# Patient Record
Sex: Male | Born: 1960 | Race: White | Hispanic: No | Marital: Married | Smoking: Never smoker
Health system: Southern US, Community
[De-identification: ages and names within clinical notes are randomized; demographics above are authoritative.]

## PROBLEM LIST (undated history)

## (undated) DIAGNOSIS — E119 Type 2 diabetes mellitus without complications: Secondary | ICD-10-CM

## (undated) DIAGNOSIS — E1140 Type 2 diabetes mellitus with diabetic neuropathy, unspecified: Secondary | ICD-10-CM

## (undated) DIAGNOSIS — E039 Hypothyroidism, unspecified: Secondary | ICD-10-CM

## (undated) DIAGNOSIS — E0590 Thyrotoxicosis, unspecified without thyrotoxic crisis or storm: Secondary | ICD-10-CM

## (undated) DIAGNOSIS — I10 Essential (primary) hypertension: Secondary | ICD-10-CM

## (undated) DIAGNOSIS — Z9641 Presence of insulin pump (external) (internal): Secondary | ICD-10-CM

## (undated) DIAGNOSIS — R011 Cardiac murmur, unspecified: Secondary | ICD-10-CM

## (undated) DIAGNOSIS — L0390 Cellulitis, unspecified: Secondary | ICD-10-CM

## (undated) DIAGNOSIS — J302 Other seasonal allergic rhinitis: Secondary | ICD-10-CM

## (undated) DIAGNOSIS — N189 Chronic kidney disease, unspecified: Secondary | ICD-10-CM

## (undated) HISTORY — PX: BACK SURGERY: SHX140

## (undated) HISTORY — PX: NECK SURGERY: SHX720

## (undated) HISTORY — PX: SHOULDER SURGERY: SHX246

## (undated) HISTORY — PX: CARDIAC CATHETERIZATION: SHX172

---

## 1985-09-25 HISTORY — PX: ANKLE RECONSTRUCTION: SHX1151

## 2005-06-15 ENCOUNTER — Emergency Department: Payer: Self-pay | Admitting: Unknown Physician Specialty

## 2005-06-15 ENCOUNTER — Other Ambulatory Visit: Payer: Self-pay

## 2006-07-21 ENCOUNTER — Inpatient Hospital Stay: Payer: Self-pay | Admitting: Rheumatology

## 2006-07-21 ENCOUNTER — Other Ambulatory Visit: Payer: Self-pay

## 2007-09-23 ENCOUNTER — Emergency Department: Payer: Self-pay | Admitting: Emergency Medicine

## 2008-04-26 ENCOUNTER — Observation Stay: Payer: Self-pay | Admitting: Internal Medicine

## 2008-04-26 ENCOUNTER — Other Ambulatory Visit: Payer: Self-pay

## 2008-05-09 ENCOUNTER — Emergency Department: Payer: Self-pay | Admitting: Emergency Medicine

## 2008-05-09 ENCOUNTER — Other Ambulatory Visit: Payer: Self-pay

## 2009-07-04 ENCOUNTER — Inpatient Hospital Stay: Payer: Self-pay | Admitting: Internal Medicine

## 2009-08-09 ENCOUNTER — Ambulatory Visit: Payer: Self-pay | Admitting: General Practice

## 2009-08-10 ENCOUNTER — Ambulatory Visit: Payer: Self-pay | Admitting: Orthopedic Surgery

## 2009-08-25 ENCOUNTER — Ambulatory Visit: Payer: Self-pay | Admitting: Orthopedic Surgery

## 2010-02-16 ENCOUNTER — Emergency Department: Payer: Self-pay | Admitting: Emergency Medicine

## 2010-08-30 ENCOUNTER — Emergency Department: Payer: Self-pay | Admitting: Emergency Medicine

## 2010-12-31 ENCOUNTER — Emergency Department: Payer: Self-pay | Admitting: Emergency Medicine

## 2011-04-04 ENCOUNTER — Observation Stay: Payer: Self-pay | Admitting: *Deleted

## 2013-02-24 ENCOUNTER — Ambulatory Visit: Payer: Self-pay | Admitting: General Practice

## 2013-11-18 ENCOUNTER — Ambulatory Visit: Payer: Self-pay | Admitting: Otolaryngology

## 2014-01-25 ENCOUNTER — Emergency Department: Payer: Self-pay | Admitting: Emergency Medicine

## 2014-01-25 DIAGNOSIS — R011 Cardiac murmur, unspecified: Secondary | ICD-10-CM | POA: Insufficient documentation

## 2014-01-25 LAB — CBC
HCT: 39.9 % — AB (ref 40.0–52.0)
HGB: 13.6 g/dL (ref 13.0–18.0)
MCH: 30.6 pg (ref 26.0–34.0)
MCHC: 34.1 g/dL (ref 32.0–36.0)
MCV: 90 fL (ref 80–100)
Platelet: 183 10*3/uL (ref 150–440)
RBC: 4.45 10*6/uL (ref 4.40–5.90)
RDW: 13 % (ref 11.5–14.5)
WBC: 6.7 10*3/uL (ref 3.8–10.6)

## 2014-01-25 LAB — BASIC METABOLIC PANEL
ANION GAP: 7 (ref 7–16)
BUN: 15 mg/dL (ref 7–18)
CHLORIDE: 99 mmol/L (ref 98–107)
CREATININE: 1.41 mg/dL — AB (ref 0.60–1.30)
Calcium, Total: 9 mg/dL (ref 8.5–10.1)
Co2: 28 mmol/L (ref 21–32)
EGFR (Non-African Amer.): 58 — ABNORMAL LOW
Glucose: 90 mg/dL (ref 65–99)
OSMOLALITY: 269 (ref 275–301)
POTASSIUM: 3.7 mmol/L (ref 3.5–5.1)
Sodium: 134 mmol/L — ABNORMAL LOW (ref 136–145)

## 2014-01-25 LAB — TROPONIN I: Troponin-I: <0.02 ng/mL

## 2014-01-28 ENCOUNTER — Ambulatory Visit: Payer: Self-pay | Admitting: General Practice

## 2014-01-29 DIAGNOSIS — E119 Type 2 diabetes mellitus without complications: Secondary | ICD-10-CM | POA: Insufficient documentation

## 2014-01-29 DIAGNOSIS — I10 Essential (primary) hypertension: Secondary | ICD-10-CM | POA: Insufficient documentation

## 2014-01-29 DIAGNOSIS — R079 Chest pain, unspecified: Secondary | ICD-10-CM | POA: Insufficient documentation

## 2014-01-29 DIAGNOSIS — E039 Hypothyroidism, unspecified: Secondary | ICD-10-CM | POA: Insufficient documentation

## 2014-05-02 ENCOUNTER — Inpatient Hospital Stay: Payer: Self-pay | Admitting: Internal Medicine

## 2014-05-02 LAB — BASIC METABOLIC PANEL
ANION GAP: 21 — AB (ref 7–16)
Anion Gap: 21 — ABNORMAL HIGH (ref 7–16)
Anion Gap: 15 (ref 7–16)
BUN: 31 mg/dL — ABNORMAL HIGH (ref 7–18)
BUN: 34 mg/dL — AB (ref 7–18)
BUN: 39 mg/dL — ABNORMAL HIGH (ref 7–18)
CALCIUM: 8.5 mg/dL (ref 8.5–10.1)
CHLORIDE: 96 mmol/L — AB (ref 98–107)
CO2: 18 mmol/L — AB (ref 21–32)
CREATININE: 2.49 mg/dL — AB (ref 0.60–1.30)
Calcium, Total: 9.1 mg/dL (ref 8.5–10.1)
Calcium, Total: 9.1 mg/dL (ref 8.5–10.1)
Chloride: 88 mmol/L — ABNORMAL LOW (ref 98–107)
Chloride: 91 mmol/L — ABNORMAL LOW (ref 98–107)
Co2: 17 mmol/L — ABNORMAL LOW (ref 21–32)
Co2: 22 mmol/L (ref 21–32)
Creatinine: 1.82 mg/dL — ABNORMAL HIGH (ref 0.60–1.30)
Creatinine: 1.8 mg/dL — ABNORMAL HIGH (ref 0.60–1.30)
EGFR (African American): 49 — ABNORMAL LOW
EGFR (African American): 34 — ABNORMAL LOW
EGFR (Non-African Amer.): 42 — ABNORMAL LOW
GFR CALC AF AMER: 50 — AB
GFR CALC NON AF AMER: 43 — AB
GFR CALC NON AF AMER: 29 — AB
GLUCOSE: 597 mg/dL — AB (ref 65–99)
GLUCOSE: 490 mg/dL — AB (ref 65–99)
Glucose: 566 mg/dL (ref 65–99)
OSMOLALITY: 298 (ref 275–301)
Osmolality: 288 (ref 275–301)
Osmolality: 294 (ref 275–301)
POTASSIUM: 6.7 mmol/L — AB (ref 3.5–5.1)
Potassium: 6.3 mmol/L — ABNORMAL HIGH (ref 3.5–5.1)
Potassium: 4.6 mmol/L (ref 3.5–5.1)
SODIUM: 129 mmol/L — AB (ref 136–145)
Sodium: 127 mmol/L — ABNORMAL LOW (ref 136–145)
Sodium: 133 mmol/L — ABNORMAL LOW (ref 136–145)

## 2014-05-02 LAB — CBC
HCT: 38.3 % — AB (ref 40.0–52.0)
HGB: 12.4 g/dL — ABNORMAL LOW (ref 13.0–18.0)
MCH: 31.1 pg (ref 26.0–34.0)
MCHC: 32.4 g/dL (ref 32.0–36.0)
MCV: 96 fL (ref 80–100)
Platelet: 225 10*3/uL (ref 150–440)
RBC: 3.99 10*6/uL — ABNORMAL LOW (ref 4.40–5.90)
RDW: 13.1 % (ref 11.5–14.5)
WBC: 11.3 10*3/uL — ABNORMAL HIGH (ref 3.8–10.6)

## 2014-05-03 LAB — URINALYSIS, COMPLETE
BILIRUBIN, UR: NEGATIVE
BLOOD: NEGATIVE
Leukocyte Esterase: NEGATIVE
Nitrite: NEGATIVE
PROTEIN: NEGATIVE
Ph: 5 (ref 4.5–8.0)
SPECIFIC GRAVITY: 1.01 (ref 1.003–1.030)

## 2014-05-03 LAB — CBC WITH DIFFERENTIAL/PLATELET
BASOS PCT: 0.8 %
Basophil #: 0.1 10*3/uL (ref 0.0–0.1)
EOS ABS: 0 10*3/uL (ref 0.0–0.7)
Eosinophil %: 0.2 %
HCT: 33.7 % — ABNORMAL LOW (ref 40.0–52.0)
HGB: 11.1 g/dL — ABNORMAL LOW (ref 13.0–18.0)
Lymphocyte #: 1.6 10*3/uL (ref 1.0–3.6)
Lymphocyte %: 14.6 %
MCH: 31.3 pg (ref 26.0–34.0)
MCHC: 32.9 g/dL (ref 32.0–36.0)
MCV: 95 fL (ref 80–100)
MONO ABS: 1 x10 3/mm (ref 0.2–1.0)
Monocyte %: 9.2 %
Neutrophil #: 8.4 10*3/uL — ABNORMAL HIGH (ref 1.4–6.5)
Neutrophil %: 75.2 %
Platelet: 220 10*3/uL (ref 150–440)
RBC: 3.55 10*6/uL — ABNORMAL LOW (ref 4.40–5.90)
RDW: 13.4 % (ref 11.5–14.5)
WBC: 11.2 10*3/uL — ABNORMAL HIGH (ref 3.8–10.6)

## 2014-05-03 LAB — BASIC METABOLIC PANEL
ANION GAP: 7 (ref 7–16)
Anion Gap: 7 (ref 7–16)
BUN: 38 mg/dL — ABNORMAL HIGH (ref 7–18)
BUN: 40 mg/dL — ABNORMAL HIGH (ref 7–18)
Calcium, Total: 8.3 mg/dL — ABNORMAL LOW (ref 8.5–10.1)
Calcium, Total: 8.4 mg/dL — ABNORMAL LOW (ref 8.5–10.1)
Chloride: 102 mmol/L (ref 98–107)
Chloride: 103 mmol/L (ref 98–107)
Co2: 27 mmol/L (ref 21–32)
Co2: 29 mmol/L (ref 21–32)
Creatinine: 1.98 mg/dL — ABNORMAL HIGH (ref 0.60–1.30)
Creatinine: 2.37 mg/dL — ABNORMAL HIGH (ref 0.60–1.30)
EGFR (African American): 36 — ABNORMAL LOW
EGFR (African American): 44 — ABNORMAL LOW
EGFR (Non-African Amer.): 31 — ABNORMAL LOW
EGFR (Non-African Amer.): 38 — ABNORMAL LOW
GLUCOSE: 137 mg/dL — AB (ref 65–99)
Glucose: 233 mg/dL — ABNORMAL HIGH (ref 65–99)
OSMOLALITY: 293 (ref 275–301)
Osmolality: 285 (ref 275–301)
Potassium: 3.9 mmol/L (ref 3.5–5.1)
Potassium: 4.8 mmol/L (ref 3.5–5.1)
SODIUM: 137 mmol/L (ref 136–145)
Sodium: 138 mmol/L (ref 136–145)

## 2014-05-03 LAB — TSH: THYROID STIMULATING HORM: 0.824 u[IU]/mL

## 2014-05-03 LAB — LIPID PANEL
Cholesterol: 123 mg/dL (ref 0–200)
HDL Cholesterol: 64 mg/dL — ABNORMAL HIGH (ref 40–60)
Ldl Cholesterol, Calc: 47 mg/dL (ref 0–100)
Triglycerides: 62 mg/dL (ref 0–200)
VLDL CHOLESTEROL, CALC: 12 mg/dL (ref 5–40)

## 2014-05-03 LAB — HEMOGLOBIN A1C: Hemoglobin A1C: 10.4 % — ABNORMAL HIGH (ref 4.2–6.3)

## 2014-05-03 LAB — MAGNESIUM: Magnesium: 2.3 mg/dL

## 2014-05-04 LAB — BASIC METABOLIC PANEL
Anion Gap: 12 (ref 7–16)
BUN: 36 mg/dL — ABNORMAL HIGH (ref 7–18)
CALCIUM: 7.9 mg/dL — AB (ref 8.5–10.1)
CHLORIDE: 102 mmol/L (ref 98–107)
CO2: 23 mmol/L (ref 21–32)
CREATININE: 1.65 mg/dL — AB (ref 0.60–1.30)
EGFR (African American): 55 — ABNORMAL LOW
GFR CALC NON AF AMER: 48 — AB
Glucose: 385 mg/dL — ABNORMAL HIGH (ref 65–99)
Osmolality: 298 (ref 275–301)
POTASSIUM: 5.4 mmol/L — AB (ref 3.5–5.1)
SODIUM: 137 mmol/L (ref 136–145)

## 2014-05-05 LAB — BASIC METABOLIC PANEL
Anion Gap: 5 — ABNORMAL LOW (ref 7–16)
BUN: 39 mg/dL — AB (ref 7–18)
CO2: 31 mmol/L (ref 21–32)
CREATININE: 1.58 mg/dL — AB (ref 0.60–1.30)
Calcium, Total: 8.6 mg/dL (ref 8.5–10.1)
Chloride: 101 mmol/L (ref 98–107)
EGFR (Non-African Amer.): 50 — ABNORMAL LOW
GFR CALC AF AMER: 58 — AB
GLUCOSE: 237 mg/dL — AB (ref 65–99)
Osmolality: 291 (ref 275–301)
Potassium: 5.4 mmol/L — ABNORMAL HIGH (ref 3.5–5.1)
Sodium: 137 mmol/L (ref 136–145)

## 2014-11-23 DIAGNOSIS — R053 Chronic cough: Secondary | ICD-10-CM | POA: Insufficient documentation

## 2014-11-23 DIAGNOSIS — R809 Proteinuria, unspecified: Secondary | ICD-10-CM | POA: Insufficient documentation

## 2014-11-23 DIAGNOSIS — R05 Cough: Secondary | ICD-10-CM | POA: Insufficient documentation

## 2014-11-23 DIAGNOSIS — E109 Type 1 diabetes mellitus without complications: Secondary | ICD-10-CM | POA: Insufficient documentation

## 2014-11-23 DIAGNOSIS — Z9641 Presence of insulin pump (external) (internal): Secondary | ICD-10-CM | POA: Insufficient documentation

## 2015-01-16 NOTE — Discharge Summary (Signed)
Dates of Admission and Diagnosis:  Date of Admission 02-May-2014   Date of Discharge 05-May-2014   Admitting Diagnosis DKA   Final Diagnosis Insulin dependent DM- presents with DKA Dehydration Neck swelling and swellowing difficulty - post neck surgery 6 days ago- improved with steroids. Htn Hyperkalemia Ac renal failure due to dehydration    Chief Complaint/History of Present Illness A 54 year old Caucasian male with a history of diabetes and hypothyroidism, presented to the ED with the above chief complaint. The patient is awake, alert, oriented, in no acute distress. The patient has neck pain, unable to talk much, history is obtained from his wife. The patient had neck cervical surgery 1 week ago. After the surgery, the patient has had slight neck swelling and pain, unable to eat and drink. The patient has less urine. According to the patient's wife, the patient's blood sugar has been high, even the patient is using an insulin pump. The patient denies any fever or chills. No headache or dizziness. No chest pain, palpitation. No abdominal pain, nausea, vomiting or diarrhea. The patient went to [facility] the [facility] the [facility], got a neck x-ray. According to the patient's wife, the surgeon said no problems with the neck surgery. The patient's blood sugar is high at 597, anion gap 21, potassium 6.7. He was treated with insulin 6 units and then started insulin drip.   Allergies:  Zolpidem: Hallucinations  Lab:  08-Aug-15 16:05   pH (ABG)  7.16 (7.35-7.45 NOTE: New Reference Range 04/18/14)  PCO2 37 (32-48 NOTE: New Reference Range 04/18/14)  PO2  77 (83-108 NOTE: New Reference Range 04/18/14)  FiO2 21  Base Excess  -14.7 (-2.0-3.0 NOTE: New Reference Range 04/18/14)  HCO3  13.2 (21-28 NOTE: New Reference Range 04/18/14)  O2 Saturation 94.0  O2 Device ra  Specimen Site (ABG) RT RADIAL  Specimen Type (ABG) ARTERIAL  Patient Temp (ABG) 37.0  Lactic Acid, Cardiopulmonary  2.3  (0.30-0.80 NOTE: New Reference Range 04/18/14)  Routine Chem:  08-Aug-15 13:22   Glucose, Serum  566  BUN  31  Creatinine (comp)  1.82  Sodium, Serum  127  Potassium, Serum  6.7  Chloride, Serum  88  CO2, Serum  18  Calcium (Total), Serum 9.1  Anion Gap  21  Osmolality (calc) 288  eGFR (African American)  49  eGFR (Non-African American)  42 (eGFR values <80mL/min/1.73 m2 may be an indication of chronic kidney disease (CKD). Calculated eGFR is useful in patients with stable renal function. The eGFR calculation will not be reliable in acutely ill patients when serum creatinine is changing rapidly. It is not useful in  patients on dialysis. The eGFR calculation may not be applicable to patients at the low and high extremes of body sizes, pregnant women, and vegetarians.)  Result Comment GLUCOSE/POTASSIUM - RESULTS VERIFIED BY REPEAT TESTING.  - NOTIFIED OF CRITICAL VALUE  - C/SONJA WEAVER AT 16 05/02/14-DAS  - READ-BACK PROCESS PERFORMED. ANION GAP - RESULTS VERIFIED BY REPEAT TESTING.  Result(s) reported on 02 May 2014 at 02:18PM.    16:05   Result Comment rb - READ-BACK PROCESS PERFORMED.  - called to sonjia, rn  Result(s) reported on 02 May 2014 at 04:17PM.  11-Aug-15 04:45   Glucose, Serum  237  BUN  39  Creatinine (comp)  1.58  Sodium, Serum 137  Potassium, Serum  5.4  Chloride, Serum 101  CO2, Serum 31  Calcium (Total), Serum 8.6  Anion Gap  5  Osmolality (calc) 291  eGFR (  African American)  58  eGFR (Non-African American)  50 (eGFR values <63mL/min/1.73 m2 may be an indication of chronic kidney disease (CKD). Calculated eGFR is useful in patients with stable renal function. The eGFR calculation will not be reliable in acutely ill patients when serum creatinine is changing rapidly. It is not useful in  patients on dialysis. The eGFR calculation may not be applicable to patients at the low and high extremes of body sizes, pregnant women, and vegetarians.)   Routine UA:  09-Aug-15 12:57   Color (UA) YELLOW  Clarity (UA) CLEAR  Glucose (UA) 300 mg/dL  Bilirubin (UA) NEGATIVE  Ketones (UA) 2+  Specific Gravity (UA) 1.010  Blood (UA) NEGATIVE  pH (UA) 5.0  Protein (UA) NEGATIVE  Nitrite (UA) NEGATIVE  Leukocyte Esterase (UA) NEGATIVE (Result(s) reported on 03 May 2014 at 02:51PM.)  RBC (UA) 0-5  WBC (UA) 0-5  Bacteria (UA) 1+ (TRACE/FEW)  Epithelial Cells (UA) 0-5 / HPF  Mucous (UA) PRESENT (Result(s) reported on 03 May 2014 at 02:51PM.)  Routine Hem:  08-Aug-15 13:22   WBC (CBC)  11.3  RBC (CBC)  3.99  Hemoglobin (CBC)  12.4  Hematocrit (CBC)  38.3  Platelet Count (CBC) 225 (Result(s) reported on 02 May 2014 at 01:34PM.)  MCV 96  MCH 31.1  MCHC 32.4  RDW 13.1   Pertinent Past History:  Pertinent Past History A 54 year old Caucasian male with a history of diabetes and hypothyroidism, presented to the ED with the above chief complaint. The patient is awake, alert, oriented, in no acute distress. The patient has neck pain, unable to talk much, history is obtained from his wife. The patient had neck cervical surgery 1 week ago. After the surgery, the patient has had slight neck swelling and pain, unable to eat and drink. The patient has less urine. According to the patient's wife, the patient's blood sugar has been high, even the patient is using an insulin pump. The patient denies any fever or chills. No headache or dizziness. No chest pain, palpitation. No abdominal pain, nausea, vomiting or diarrhea. The patient went to see the surgeon in the special hospital, got a neck x-ray. According to the patient's wife, the surgeon said no problems with the neck surgery. The patient's blood sugar is high at 597, anion gap 21, potassium 6.7. He was treated with insulin 6 units and then started insulin drip.   Hospital Course:  Hospital Course 54 m with IDDM, HTN, CKD3 with recent cervical fusion and diskecteomy here with DKA  *  DKA resolved. BS better. Isnulin pump to restarted- with better control of blood sugar now. Unclear why he had DKA. Due to severe dehydration/stress? not eating well? post surgery- neck swelling. Consulted Dr. Gabriel Carina for further recommendations, but she is on vacation with no back up coverage. I spoke to pt and his wife, they are very well educated about carb count and settign insulin pump to take injections.   Told them to restart it now, monitored blood sugar- remained stable.  * ARF with hyperkalemia on CRF - Improving Continued IVF at 150/hr Due to dehydration and DKA.  * HTN meds held due to low normal BP and ARF  * Dyphagia from recent neck surgery Started decadron IV TID given prednoson 1 dose PO, pt already feels much better in his swelling and tolerating soft diet. advised to continue soft diet at home for few days- received only 2 days of steroids- so no need for tapering.  * Pseudohyponatremia- improved with  correction of blood sugar.  * Hyperkalemia- given kayexalate. restarted on insulin pump- and have appointment with Dr. Gabriel Carina in office in next week.   Condition on Discharge Stable   Code Status:  Code Status Full Code   DISCHARGE INSTRUCTIONS HOME MEDS:  Medication Reconciliation: Patient's Home Medications at Discharge:     Medication Instructions  zyrtec 10 mg oral tablet  1 tab(s) orally once a day   nasacort 55 mcg/inh nasal spray  2 spray(s) into each nostril once a day   levothyroxine 175 mcg (0.175 mg) oral tablet  1 tab(s) orally once a day   aspirin 81 mg oral tablet  1 tab(s) orally once a day   oxycodone 5 mg oral tablet  1-2 tab(s) orally every 6 hours, As Needed   enalapril-hydrochlorothiazide 10 mg-25 mg oral tablet  1 tab(s) orally once a day   humalog  Inside insulin pump    STOP TAKING THE FOLLOWING MEDICATION(S):    meloxicam 15 mg oral tablet: 1 tab(s) orally once a day  Physician's Instructions:  Diet Low Sodium  Carbohydrate  Controlled (ADA) Diet   Activity Limitations As tolerated   Return to Work Not Applicable   Time frame for Follow Up Appointment 1-2 weeks  Dr. Gabriel Carina   Other Comments follow with Dr. Gabriel Carina in office in 1 week.   Electronic Signatures: Vaughan Basta (MD)  (Signed 11-Aug-15 16:15)  Authored: ADMISSION DATE AND DIAGNOSIS, CHIEF COMPLAINT/HPI, Allergies, PERTINENT LABS, PERTINENT PAST [place] MEDS, PATIENT INSTRUCTIONS   Last Updated: 11-Aug-15 16:15 by Vaughan Basta (MD)

## 2015-01-16 NOTE — H&P (Signed)
PATIENT NAME:  Jeffrey Medina, [name] MR#:  [id number] DATE OF BIRTH:  02-09-1961  DATE OF ADMISSION:  05/02/2014  PRIMARY CARE PHYSICIAN: Dr. Terance HartBronstein.   REFERRING PHYSICIAN: Dr. Carollee MassedKaminski.   CHIEF COMPLAINT: Weakness and high blood sugar for several days.   HISTORY OF PRESENT ILLNESS: A 54 year old Caucasian male with a history of diabetes and hypothyroidism, presented to the ED with the above chief complaint. The patient is awake, alert, oriented, in no acute distress. The patient has neck pain, unable to talk much, history is obtained from his wife. The patient had neck cervical surgery 1 week ago. After the surgery, the patient has had slight neck swelling and pain, unable to eat and drink. The patient has less urine. According to the patient's wife, the patient's blood sugar has been high, even the patient is using an insulin pump. The patient denies any fever or chills. No headache or dizziness. No chest pain, palpitation. No abdominal pain, nausea, vomiting or diarrhea. The patient went to see the surgeon in the special [facility], got a neck x-ray. According to the patient's wife, the surgeon said no problems with the neck surgery. The patient's blood sugar is high at 597, anion gap 21, potassium 6.7. He was treated with insulin 6 units and then started insulin drip.   PAST MEDICAL HISTORY: Diabetes 1,  hypothyroidism,   PAST SURGICAL HISTORY: Anterior cervical diskectomy last week, sinus surgery this March,  right ankle reconstruction, shoulder and back surgery due to bone spur.   ALLERGIES: ZOLPIDEM.   SOCIAL HISTORY: No smoking, no drinking, or illicit drugs.   FAMILY HISTORY: CAD.   HOME MEDICATIONS:  1.  Zyrtec 10 mg p.o. daily.  2.  Oxycodone 5 mg p.o. 1 to 2 tablets every 6 hours p.r.n.  3.  Nasacort 50 mcg inhalation, 2 sprays into each nostril once a day.  4. Meloxicam 50 mg p.o. daily.  5. Levothyroxine 175 mcg p.o. daily.  6.  Humalog insulin pump.  7.  Enalapril, HCTZ 10  mg/25 mg p.o. daily.  8.  Aspirin 81 mg p.o. daily.   REVIEW OF SYSTEMS: CONSTITUTIONAL: The patient denies any fever or chills. No headache or dizziness.  EYES: No double vision, blurred vision.  ENT: No postnasal drip, slurred speech, but has dysphagia due to neck pain.  CARDIOVASCULAR: No chest pain, palpitation, orthopnea, nocturnal dyspnea. No leg edema.  PULMONARY: No cough, sputum, shortness of breath, or hemoptysis.  GASTROINTESTINAL: No abdominal pain, nausea, vomiting, diarrhea. No melena or bloody stool.  GENITOURINARY: No dysuria, hematuria, or incontinence.  SKIN: No rash or jaundice.  NEUROLOGY: No syncope, loss of consciousness, or seizure.  ENDOCRINOLOGY: No polyuria, polydipsia, heat or cold intolerance.  HEMATOLOGY: No easy bleeding or bleeding.   PHYSICAL EXAMINATION: VITAL SIGNS: Temperature 98.5, blood pressure 100/52, pulse 103, oxygen saturation 98% on room air.  GENERAL: The patient is alert, awake, oriented, in no acute distress.  HEENT: Pupils round, equal, and reactive to light and accommodation. Moist oral mucosa. Unable to see oropharynx.  NECK: Supple. No JVD. No carotid bruits, but has neck swollen with a surgical wound on the left side, tenderness. No erythema. Difficult to estimate whether the patient has lymphadenopathy due to neck swelling and tenderness.  CARDIOVASCULAR: S1, S2. Regular rate, rhythm. No murmur, rubs, gallops.  PULMONARY: Bilateral air entry. No wheezing or rales. No use of accessory muscles to breathe.  ABDOMEN: Soft, no distention, or tenderness. No organomegaly. Bowel sounds present.  EXTREMITIES: No edema, clubbing, or  cyanosis. No calf tenderness. Bilateral pedal pulses present.   SKIN: No rash or jaundice.  NEUROLOGIC: Alert and oriented x 3. No focal deficits. Power 5/5. Sensory intact.     LABORATORY DATA: Glucose 597, BUN 34, creatinine 1.8, potassium 6.3, sodium 129, chloride 91, bicarbonate 17, WBC 11.3, hemoglobin 12.4,  platelets 225. Anion gap 21.    IMPRESSIONS: 1.  Diabetic ketoacidosis.  2.  Hyperkalemia.  3.  Acute renal failure.  4.  Hyponatremia.  5.  Diabetes 1.  6.  Hypothyroidism.   PLAN OF TREATMENT: 1.  The patient will be admitted to CCU. Will start DKA protocol. Continue insulin drip.  2.  Will get normal saline IV. Hold enalapril and HCTZ. Follow up BMP.  3.  Pain control.   I discussed the patient's condition and the plan of treatment with the patient and the patient's wife. The patient wants full code.   CRITICAL TIME: 62 minutes    ____________________________ Shaune Pollack, MD qc:at D: 05/02/2014 16:18:50 ET T: 05/02/2014 17:40:59 ET JOB#: [id number]  cc: Shaune Pollack, MD, <Dictator> Shaune Pollack MD ELECTRONICALLY SIGNED 05/03/2014 15:34

## 2015-06-04 ENCOUNTER — Ambulatory Visit
Admission: RE | Admit: 2015-06-04 | Discharge: 2015-06-04 | Disposition: A | Discharge status: Home / Self Care | Payer: PRIVATE HEALTH INSURANCE | Source: Ambulatory Visit | Attending: Family Medicine | Admitting: Family Medicine

## 2015-06-04 DIAGNOSIS — L0390 Cellulitis, unspecified: Secondary | ICD-10-CM | POA: Insufficient documentation

## 2015-06-04 HISTORY — DX: Cellulitis, unspecified: L03.90

## 2015-06-04 HISTORY — DX: Thyrotoxicosis, unspecified without thyrotoxic crisis or storm: E05.90

## 2015-06-04 HISTORY — DX: Type 2 diabetes mellitus without complications: E11.9

## 2015-06-04 MED ORDER — CLINDAMYCIN PHOSPHATE 600 MG/50ML IV SOLN
600.0000 mg | Freq: Once | INTRAVENOUS | Status: AC
  Administered 2015-06-04: 600 mg via INTRAVENOUS

## 2015-06-04 MED ORDER — SODIUM CHLORIDE 0.9 % IV SOLN
INTRAVENOUS | Status: DC
  Administered 2015-06-04: 11:00:00 via INTRAVENOUS

## 2015-06-04 NOTE — Discharge Instructions (Signed)
Follow up with your Primary doctor or Employee health.  Take medications as prescribed by employee health.  Call [phone]. Same Day Surgery if you shouold have any questions.

## 2015-06-04 NOTE — OR Nursing (Signed)
Tolerated Antibiotic infusion well.  Instructed to call Employee Health Services or PCP if any increased symptoms and to keep any follow up appointments.  Take prescriptions as given from employee health services.  Verbalizes understanding.

## 2015-06-09 ENCOUNTER — Ambulatory Visit
Admission: RE | Admit: 2015-06-09 | Discharge: 2015-06-09 | Disposition: A | Discharge status: Home / Self Care | Payer: PRIVATE HEALTH INSURANCE | Source: Ambulatory Visit | Attending: Family Medicine | Admitting: Family Medicine

## 2015-06-09 DIAGNOSIS — L03119 Cellulitis of unspecified part of limb: Secondary | ICD-10-CM | POA: Insufficient documentation

## 2015-06-09 DIAGNOSIS — E119 Type 2 diabetes mellitus without complications: Secondary | ICD-10-CM | POA: Diagnosis present

## 2015-06-09 LAB — GLUCOSE, CAPILLARY: Glucose-Capillary: 82 mg/dL (ref 65–99)

## 2015-06-09 MED ORDER — SODIUM CHLORIDE 0.9 % IV SOLN
INTRAVENOUS | Status: DC
  Administered 2015-06-09: 75 mL/h via INTRAVENOUS

## 2015-06-09 MED ORDER — SODIUM CHLORIDE 0.9 % IV SOLN
1.0000 g | Freq: Once | INTRAVENOUS | Status: AC
  Administered 2015-06-09: 1 g via INTRAVENOUS
  Filled 2015-06-09: qty 1

## 2015-06-09 NOTE — Progress Notes (Signed)
Pt here for antibiotic therapy 1/3 days, discharged ambulatory, without issue. IV D/Ced

## 2015-06-10 ENCOUNTER — Ambulatory Visit
Admission: RE | Admit: 2015-06-10 | Discharge: 2015-06-10 | Disposition: A | Discharge status: Home / Self Care | Payer: PRIVATE HEALTH INSURANCE | Source: Ambulatory Visit | Attending: Family Medicine | Admitting: Family Medicine

## 2015-06-10 DIAGNOSIS — L0390 Cellulitis, unspecified: Secondary | ICD-10-CM | POA: Diagnosis not present

## 2015-06-10 MED ORDER — SODIUM CHLORIDE 0.9 % IV SOLN
1.0000 g | Freq: Once | INTRAVENOUS | Status: AC
  Administered 2015-06-10: 1 g via INTRAVENOUS
  Filled 2015-06-10: qty 1

## 2015-06-10 MED ORDER — ERTAPENEM SODIUM 1 G IJ SOLR
1.0000 g | INTRAMUSCULAR | Status: DC
  Filled 2015-06-10: qty 1

## 2015-06-10 MED ORDER — SODIUM CHLORIDE 0.9 % IV SOLN: INTRAVENOUS | Status: DC

## 2015-06-11 ENCOUNTER — Ambulatory Visit
Admission: RE | Admit: 2015-06-11 | Discharge: 2015-06-11 | Disposition: A | Discharge status: Home / Self Care | Payer: PRIVATE HEALTH INSURANCE | Source: Ambulatory Visit | Attending: Family Medicine | Admitting: Family Medicine

## 2015-06-11 DIAGNOSIS — L0390 Cellulitis, unspecified: Secondary | ICD-10-CM | POA: Diagnosis not present

## 2015-06-11 MED ORDER — SODIUM CHLORIDE 0.9 % IV SOLN
INTRAVENOUS | Status: DC
  Administered 2015-06-11: 10:00:00 via INTRAVENOUS

## 2015-06-11 MED ORDER — SODIUM CHLORIDE 0.9 % IV SOLN
1.0000 g | Freq: Once | INTRAVENOUS | Status: AC
  Administered 2015-06-11: 1 g via INTRAVENOUS
  Filled 2015-06-11: qty 1

## 2015-06-23 ENCOUNTER — Ambulatory Visit: Payer: Self-pay | Admitting: Physician Assistant

## 2015-06-23 ENCOUNTER — Encounter: Payer: Self-pay | Admitting: Physician Assistant

## 2015-06-23 VITALS — BP 150/80 | HR 76 | Temp 97.7°F

## 2015-06-23 DIAGNOSIS — F809 Developmental disorder of speech and language, unspecified: Secondary | ICD-10-CM

## 2015-06-23 NOTE — Progress Notes (Signed)
S:  Pt wants [place] to fill out short term disability forms.  States the doctor told him he needed a handicap sticker because the bones in his foot are deteriorating and he can't walk very far.  No complaints today  O: vitals wnl, nad, appears well , neuro intact  A: consult for handicap sticker and short term disability forms to be filled out  P: explained to pt that he should have the doctor that is treating him for these problems fill out the paperwork, pt states he understands

## 2015-07-08 ENCOUNTER — Emergency Department
Admission: EM | Admit: 2015-07-08 | Discharge: 2015-07-08 | Disposition: A | Discharge status: Home / Self Care | Payer: PRIVATE HEALTH INSURANCE | Attending: Emergency Medicine | Admitting: Emergency Medicine

## 2015-07-08 ENCOUNTER — Emergency Department: Payer: PRIVATE HEALTH INSURANCE

## 2015-07-08 ENCOUNTER — Encounter: Payer: Self-pay | Admitting: *Deleted

## 2015-07-08 DIAGNOSIS — J209 Acute bronchitis, unspecified: Secondary | ICD-10-CM | POA: Diagnosis not present

## 2015-07-08 DIAGNOSIS — Z792 Long term (current) use of antibiotics: Secondary | ICD-10-CM | POA: Diagnosis not present

## 2015-07-08 DIAGNOSIS — B349 Viral infection, unspecified: Secondary | ICD-10-CM | POA: Diagnosis not present

## 2015-07-08 DIAGNOSIS — Z794 Long term (current) use of insulin: Secondary | ICD-10-CM | POA: Diagnosis not present

## 2015-07-08 DIAGNOSIS — E119 Type 2 diabetes mellitus without complications: Secondary | ICD-10-CM | POA: Insufficient documentation

## 2015-07-08 DIAGNOSIS — R079 Chest pain, unspecified: Secondary | ICD-10-CM | POA: Diagnosis present

## 2015-07-08 DIAGNOSIS — Z7982 Long term (current) use of aspirin: Secondary | ICD-10-CM | POA: Diagnosis not present

## 2015-07-08 DIAGNOSIS — I10 Essential (primary) hypertension: Secondary | ICD-10-CM | POA: Diagnosis not present

## 2015-07-08 DIAGNOSIS — Z79899 Other long term (current) drug therapy: Secondary | ICD-10-CM | POA: Diagnosis not present

## 2015-07-08 HISTORY — DX: Essential (primary) hypertension: I10

## 2015-07-08 LAB — TROPONIN I

## 2015-07-08 LAB — CBC
HCT: 40 % (ref 40.0–52.0)
HEMOGLOBIN: 14 g/dL (ref 13.0–18.0)
MCH: 32.7 pg (ref 26.0–34.0)
MCHC: 35.1 g/dL (ref 32.0–36.0)
MCV: 93 fL (ref 80.0–100.0)
Platelets: 147 10*3/uL — ABNORMAL LOW (ref 150–440)
RBC: 4.3 MIL/uL — ABNORMAL LOW (ref 4.40–5.90)
RDW: 13.7 % (ref 11.5–14.5)
WBC: 7.2 10*3/uL (ref 3.8–10.6)

## 2015-07-08 LAB — BASIC METABOLIC PANEL
ANION GAP: 5 (ref 5–15)
BUN: 14 mg/dL (ref 6–20)
CALCIUM: 9.4 mg/dL (ref 8.9–10.3)
CO2: 30 mmol/L (ref 22–32)
Chloride: 98 mmol/L — ABNORMAL LOW (ref 101–111)
Creatinine, Ser: 1.23 mg/dL (ref 0.61–1.24)
GLUCOSE: 137 mg/dL — AB (ref 65–99)
POTASSIUM: 3.9 mmol/L (ref 3.5–5.1)
SODIUM: 133 mmol/L — AB (ref 135–145)

## 2015-07-08 MED ORDER — ALBUTEROL SULFATE (2.5 MG/3ML) 0.083% IN NEBU: 2.5000 mg | INHALATION_SOLUTION | RESPIRATORY_TRACT | Status: AC | PRN

## 2015-07-08 MED ORDER — IPRATROPIUM-ALBUTEROL 0.5-2.5 (3) MG/3ML IN SOLN
3.0000 mL | Freq: Once | RESPIRATORY_TRACT | Status: AC
  Administered 2015-07-08: 3 mL via RESPIRATORY_TRACT
  Filled 2015-07-08: qty 3

## 2015-07-08 MED ORDER — DEXAMETHASONE 6 MG PO TABS
6.0000 mg | ORAL_TABLET | Freq: Once | ORAL | Status: AC
  Administered 2015-07-08: 6 mg via ORAL
  Filled 2015-07-08: qty 1

## 2015-07-08 MED ORDER — AZITHROMYCIN 250 MG PO TABS: ORAL_TABLET | ORAL | Status: DC

## 2015-07-08 NOTE — ED Provider Notes (Signed)
[facility] Emergency Department Provider Note  ____________________________________________  Time seen: 8:50 PM  I have reviewed the triage vital signs and the nursing notes.   HISTORY  Chief Complaint Chest Pain    HPI Jeffrey Medina is a 54 y.o. male who complains of gradual onset waxing and waning generalized headache all day today that been constant. No vision changes numbness tingling weakness dizziness or syncope. No thunderclap headache.   He also complains of concurrent chest pain that is waxing and waning off and on all day. It is not exertional and not pleuritic. It is described as pressure or tightness in the mid chest without aggravating or alleviating factors. No associated nausea vomiting shortness of breath radiation or diaphoresis. The patient's grandchildren recently visited them in one of them has bronchiolitis and viral illness currently.  Patient has hypertension and diabetes on insulin pump. No hyperlipidemia. No smoking. No family history of CAD. He had a heart catheterization by Dr. Darrold Junker about 2 years ago which was unremarkable per the patient. He was seen in his primary care office yesterday with a started him on amlodipine for hypertension due to his blood pressure being about 200/100. It was 170/90 when they sent him home.    Past Medical History  Diagnosis Date  . Hyperthyroidism   . Diabetes mellitus without complication (HCC)   . Cellulitis     left ankle  . Hypertension      There are no active problems to display for this patient.    Past Surgical History  Procedure Laterality Date  . Shoulder surgery Bilateral   . Back surgery    . Neck surgery       Current Outpatient Rx  Name  Route  Sig  Dispense  Refill  . amLODipine (NORVASC) 10 MG tablet   Oral   Take 10 mg by mouth daily.         Marland Kitchen aspirin EC 81 MG tablet   Oral   Take by mouth.         . cetirizine (ZYRTEC) 10 MG tablet   Oral   Take  by mouth.         . enalapril-hydrochlorothiazide (VASERETIC) 10-25 MG tablet   Oral   Take 1 tablet by mouth daily.         Marland Kitchen gabapentin (NEURONTIN) 300 MG capsule   Oral   Take 300-600 mg by mouth 2 (two) times daily. Take 1 capsule in the morning and take 2 capsules at bedtime         . Insulin Disposable Pump (OMNIPOD) MISC      USE AS DIRECTED, CHANGING EVERY 2 TO 3 DAYS         . insulin lispro (HUMALOG) 100 UNIT/ML injection      Use as directed in omipod pump         . levothyroxine (SYNTHROID, LEVOTHROID) 175 MCG tablet   Oral   Take 175 mcg by mouth daily before breakfast.         . sildenafil (VIAGRA) 100 MG tablet   Oral   Take 100 mg by mouth daily as needed for erectile dysfunction.         Marland Kitchen albuterol (PROVENTIL) (2.5 MG/3ML) 0.083% nebulizer solution   Nebulization   Take 3 mLs (2.5 mg total) by nebulization every 4 (four) hours as needed for wheezing or shortness of breath.   75 mL   0   . azithromycin (ZITHROMAX Z-PAK) 250  MG tablet      Take 2 tablets (500 mg) on  Day 1,  followed by 1 tablet (250 mg) once daily on Days 2 through 5.   6 each   0      Allergies Review of patient's allergies indicates no known allergies.   No family history on file.  Social History Social History  Substance Use Topics  . Smoking status: Unknown If Ever Smoked  . Smokeless tobacco: None  . Alcohol Use: None    Review of Systems  Constitutional:   No fever or chills. No weight changes Eyes:   No blurry vision or double vision.  ENT:   No sore throat. Cardiovascular:   Positive as above chest pain. Respiratory:   No dyspnea or cough. Gastrointestinal:   Negative for abdominal pain, vomiting and diarrhea.  No BRBPR or melena. Genitourinary:   Negative for dysuria, urinary retention, bloody urine, or difficulty urinating. Musculoskeletal:   Negative for back pain. No joint swelling or pain. Skin:   Negative for rash. Neurological:   Negative  for headaches, focal weakness or numbness. Psychiatric:  No anxiety or depression.   Endocrine:  No hot/cold intolerance, changes in energy, or sleep difficulty.  10-point ROS otherwise negative.  ____________________________________________   PHYSICAL EXAM:  VITAL SIGNS: ED Triage Vitals  Enc Vitals Group     BP 07/08/15 1707 156/95 mmHg     Pulse Rate 07/08/15 1707 66     Resp 07/08/15 1707 18     Temp 07/08/15 1707 97.9 F (36.6 C)     Temp Source 07/08/15 1707 Oral     SpO2 07/08/15 1707 99 %     Weight 07/08/15 1707 180 lb (81.647 kg)     Height --      Head Cir --      Peak Flow --      Pain Score 07/08/15 1707 7     Pain Loc --      Pain Edu? --      Excl. in GC? --      Constitutional:   Alert and oriented. Well appearing and in no distress. Eyes:   No scleral icterus. No conjunctival pallor. PERRL. EOMI ENT   Head:   Normocephalic and atraumatic.   Nose:   No congestion/rhinnorhea. No septal hematoma   Mouth/Throat:   MMM, mild pharyngeal erythema. No peritonsillar mass. No uvula shift.   Neck:   No stridor. No SubQ emphysema. No meningismus. Hematological/Lymphatic/Immunilogical:   No cervical lymphadenopathy. Cardiovascular:   RRR. Normal and symmetric distal pulses are present in all extremities. No murmurs, rubs, or gallops. Respiratory:   Normal respiratory effort without tachypnea nor retractions. Breath sounds are clear and equal bilaterally. No wheezes/rales/rhonchi. There is however inducible wheezing and bronchospastic coughing fit with forced expiration Gastrointestinal:   Soft and nontender. No distention. There is no CVA tenderness.  No rebound, rigidity, or guarding. Genitourinary:   deferred Musculoskeletal:   Nontender with normal range of motion in all extremities. No joint effusions.  No lower extremity tenderness.  No edema. Neurologic:   Normal speech and language.  CN 2-10 normal. Motor grossly intact. No pronator drift.   Normal gait. No gross focal neurologic deficits are appreciated.  Skin:    Skin is warm, dry and intact. No rash noted.  No petechiae, purpura, or bullae. Psychiatric:   Mood and affect are normal. Speech and behavior are normal. Patient exhibits appropriate insight and judgment.  ____________________________________________  LABS (pertinent positives/negatives) (all labs ordered are listed, but only abnormal results are displayed) Labs Reviewed  BASIC METABOLIC PANEL - Abnormal; Notable for the following:    Sodium 133 (*)    Chloride 98 (*)    Glucose, Bld 137 (*)    All other components within normal limits  CBC - Abnormal; Notable for the following:    RBC 4.30 (*)    Platelets 147 (*)    All other components within normal limits  TROPONIN I   ____________________________________________   EKG  Interpreted by me  Date: 07/08/2015  Rate: 67  Rhythm: normal sinus rhythm  QRS Axis: normal  Intervals: normal  ST/T Wave abnormalities: normal  Conduction Disutrbances: none  Narrative Interpretation: unremarkable      ____________________________________________    RADIOLOGY  Chest x-ray unremarkable  ____________________________________________   PROCEDURES   ____________________________________________   INITIAL IMPRESSION / ASSESSMENT AND PLAN / ED COURSE  Pertinent labs & imaging results that were available during my care of the patient were reviewed by me and considered in my medical decision making (see chart for details).  Patient presents with chest pain which is consistent with acute bronchitis and viral syndrome. Labs are unremarkable including a troponin that was drawn about 6 hours after symptom onset. Heart score is low. Patient is suitable for outpatient follow-up. We'll give him a dose of Decadron here to help through the symptom course as well as a DuoNeb. He has a nebulizer at home so prescribed an albuterol nebulizer solution to take as  needed at home. I encouraged him to follow up with cardiology for further evaluation in the future. He has follow-up appointment at the [facility] tomorrow. Due to his underlying diabetes, we'll start him on azithromycin to protect against pneumonia.     ____________________________________________   FINAL CLINICAL IMPRESSION(S) / ED DIAGNOSES  Final diagnoses:  Acute bronchitis, unspecified organism  Viral syndrome      Sharman Cheek, MD 07/08/15 2116

## 2015-07-08 NOTE — Discharge Instructions (Signed)
Acute Bronchitis °Bronchitis is inflammation of the airways that extend from the windpipe into the lungs (bronchi). The inflammation often causes mucus to develop. This leads to a cough, which is the most common symptom of bronchitis.  °In acute bronchitis, the condition usually develops suddenly and goes away over time, usually in a couple weeks. Smoking, allergies, and asthma can make bronchitis worse. Repeated episodes of bronchitis may cause further lung problems.  °CAUSES °Acute bronchitis is most often caused by the same virus that causes a cold. The virus can spread from person to person (contagious) through coughing, sneezing, and touching contaminated objects. °SIGNS AND SYMPTOMS  °· Cough.   °· Fever.   °· Coughing up mucus.   °· Body aches.   °· Chest congestion.   °· Chills.   °· Shortness of breath.   °· Sore throat.   °DIAGNOSIS  °Acute bronchitis is usually diagnosed through a physical exam. Your health care provider will also ask you questions about your medical history. Tests, such as chest X-rays, are sometimes done to rule out other conditions.  °TREATMENT  °Acute bronchitis usually goes away in a couple weeks. Oftentimes, no medical treatment is necessary. Medicines are sometimes given for relief of fever or cough. Antibiotic medicines are usually not needed but may be prescribed in certain situations. In some cases, an inhaler may be recommended to help reduce shortness of breath and control the cough. A cool mist vaporizer may also be used to help thin bronchial secretions and make it easier to clear the chest.  °HOME CARE INSTRUCTIONS °· Get plenty of rest.   °· Drink enough fluids to keep your urine clear or pale yellow (unless you have a medical condition that requires fluid restriction). Increasing fluids may help thin your respiratory secretions (sputum) and reduce chest congestion, and it will prevent dehydration.   °· Take medicines only as directed by your health care provider. °· If  you were prescribed an antibiotic medicine, finish it all even if you start to feel better. °· Avoid smoking and secondhand smoke. Exposure to cigarette smoke or irritating chemicals will make bronchitis worse. If you are a smoker, consider using nicotine gum or skin patches to help control withdrawal symptoms. Quitting smoking will help your lungs heal faster.   °· Reduce the chances of another bout of acute bronchitis by washing your hands frequently, avoiding people with cold symptoms, and trying not to touch your hands to your mouth, nose, or eyes.   °· Keep all follow-up visits as directed by your health care provider.   °SEEK MEDICAL CARE IF: °Your symptoms do not improve after 1 week of treatment.  °SEEK IMMEDIATE MEDICAL CARE IF: °· You develop an increased fever or chills.   °· You have chest pain.   °· You have severe shortness of breath. °· You have bloody sputum.   °· You develop dehydration. °· You faint or repeatedly feel like you are going to pass out. °· You develop repeated vomiting. °· You develop a severe headache. °MAKE SURE YOU:  °· Understand these instructions. °· Will watch your condition. °· Will get help right away if you are not doing well or get worse. °  °This information is not intended to replace advice given to you by your health care provider. Make sure you discuss any questions you have with your health care provider. °  °Document Released: 10/19/2004 Document Revised: 10/02/2014 Document Reviewed: 03/04/2013 °Elsevier Interactive Patient Education ©2016 [employer] ° °Viral Infections °A viral infection can be caused by different types of viruses. Most viral infections are not serious   and resolve on their own. However, some infections may cause severe symptoms and may lead to further complications. °SYMPTOMS °Viruses can frequently cause: °· Minor sore throat. °· Aches and pains. °· Headaches. °· Runny nose. °· Different types of rashes. °· Watery  eyes. °· Tiredness. °· Cough. °· Loss of appetite. °· Gastrointestinal infections, resulting in nausea, vomiting, and diarrhea. °These symptoms do not respond to antibiotics because the infection is not caused by bacteria. However, you might catch a bacterial infection following the viral infection. This is sometimes called a "superinfection." Symptoms of such a bacterial infection may include: °· Worsening sore throat with pus and difficulty swallowing. °· Swollen neck glands. °· Chills and a high or persistent fever. °· Severe headache. °· Tenderness over the sinuses. °· Persistent overall ill feeling (malaise), muscle aches, and tiredness (fatigue). °· Persistent cough. °· Yellow, green, or brown mucus production with coughing. °HOME CARE INSTRUCTIONS  °· Only take over-the-counter or prescription medicines for pain, discomfort, diarrhea, or fever as directed by your caregiver. °· Drink enough water and fluids to keep your urine clear or pale yellow. Sports drinks can provide valuable electrolytes, sugars, and hydration. °· Get plenty of rest and maintain proper nutrition. Soups and broths with crackers or rice are fine. °SEEK IMMEDIATE MEDICAL CARE IF:  °· You have severe headaches, shortness of breath, chest pain, neck pain, or an unusual rash. °· You have uncontrolled vomiting, diarrhea, or you are unable to keep down fluids. °· You or your child has an oral temperature above 102° F (38.9° C), not controlled by medicine. °· Your baby is older than 3 months with a rectal temperature of 102° F (38.9° C) or higher. °· Your baby is 3 months old or younger with a rectal temperature of 100.4° F (38° C) or higher. °MAKE SURE YOU:  °· Understand these instructions. °· Will watch your condition. °· Will get help right away if you are not doing well or get worse. °  °This information is not intended to replace advice given to you by your health care provider. Make sure you discuss any questions you have with your health  care provider. °  °Document Released: 06/21/2005 Document Revised: 12/04/2011 Document Reviewed: 02/17/2015 °Elsevier Interactive Patient Education ©2016 [employer] ° °

## 2015-07-08 NOTE — ED Notes (Signed)
Pt reports onset of headache and chest pain this morning. Pt [facility], and had his medications adjusted.

## 2015-08-25 DIAGNOSIS — R200 Anesthesia of skin: Secondary | ICD-10-CM | POA: Insufficient documentation

## 2015-09-28 ENCOUNTER — Encounter: Payer: Self-pay | Admitting: Physician Assistant

## 2015-09-28 ENCOUNTER — Ambulatory Visit: Payer: Self-pay | Admitting: Family

## 2015-09-28 VITALS — BP 128/80 | HR 78 | Temp 98.0°F

## 2015-09-28 DIAGNOSIS — J329 Chronic sinusitis, unspecified: Secondary | ICD-10-CM

## 2015-09-28 MED ORDER — LEVOFLOXACIN 500 MG PO TABS: 500.0000 mg | ORAL_TABLET | Freq: Every day | ORAL | Status: DC

## 2015-09-28 NOTE — Progress Notes (Signed)
S/ 55 y/o with multiple chronic medical problems on insulin pump c/o sinus infection and left ear bleeding , wife "cleaned ears " a week ago , and he has use h2o2 no pain or fever , denies cp or sob  O/ VSS BS 189 per meter ENT left eac with dried blood inferiorly nontender tms clear nasal mucosa red ,swollen and left with purulent rhinorhea, left max tender throat clear Neck supple heart rsr lungs clear A/ left max sinusitis  Resolving  Trauma to ear from over cleaning  P/ levaquin 500 x 14 day. Continue supportive measures and nasal saline products.  Keep FB out of EACs.

## 2015-11-06 IMAGING — CR DG CHEST 2V
2 series · 2 of 2 positions shown · non-contrast
Comparison: January 25, 2014

CLINICAL DATA: Cough and postnasal drainage for 2 days.

EXAM:
CHEST  2 VIEW

[chest pa]
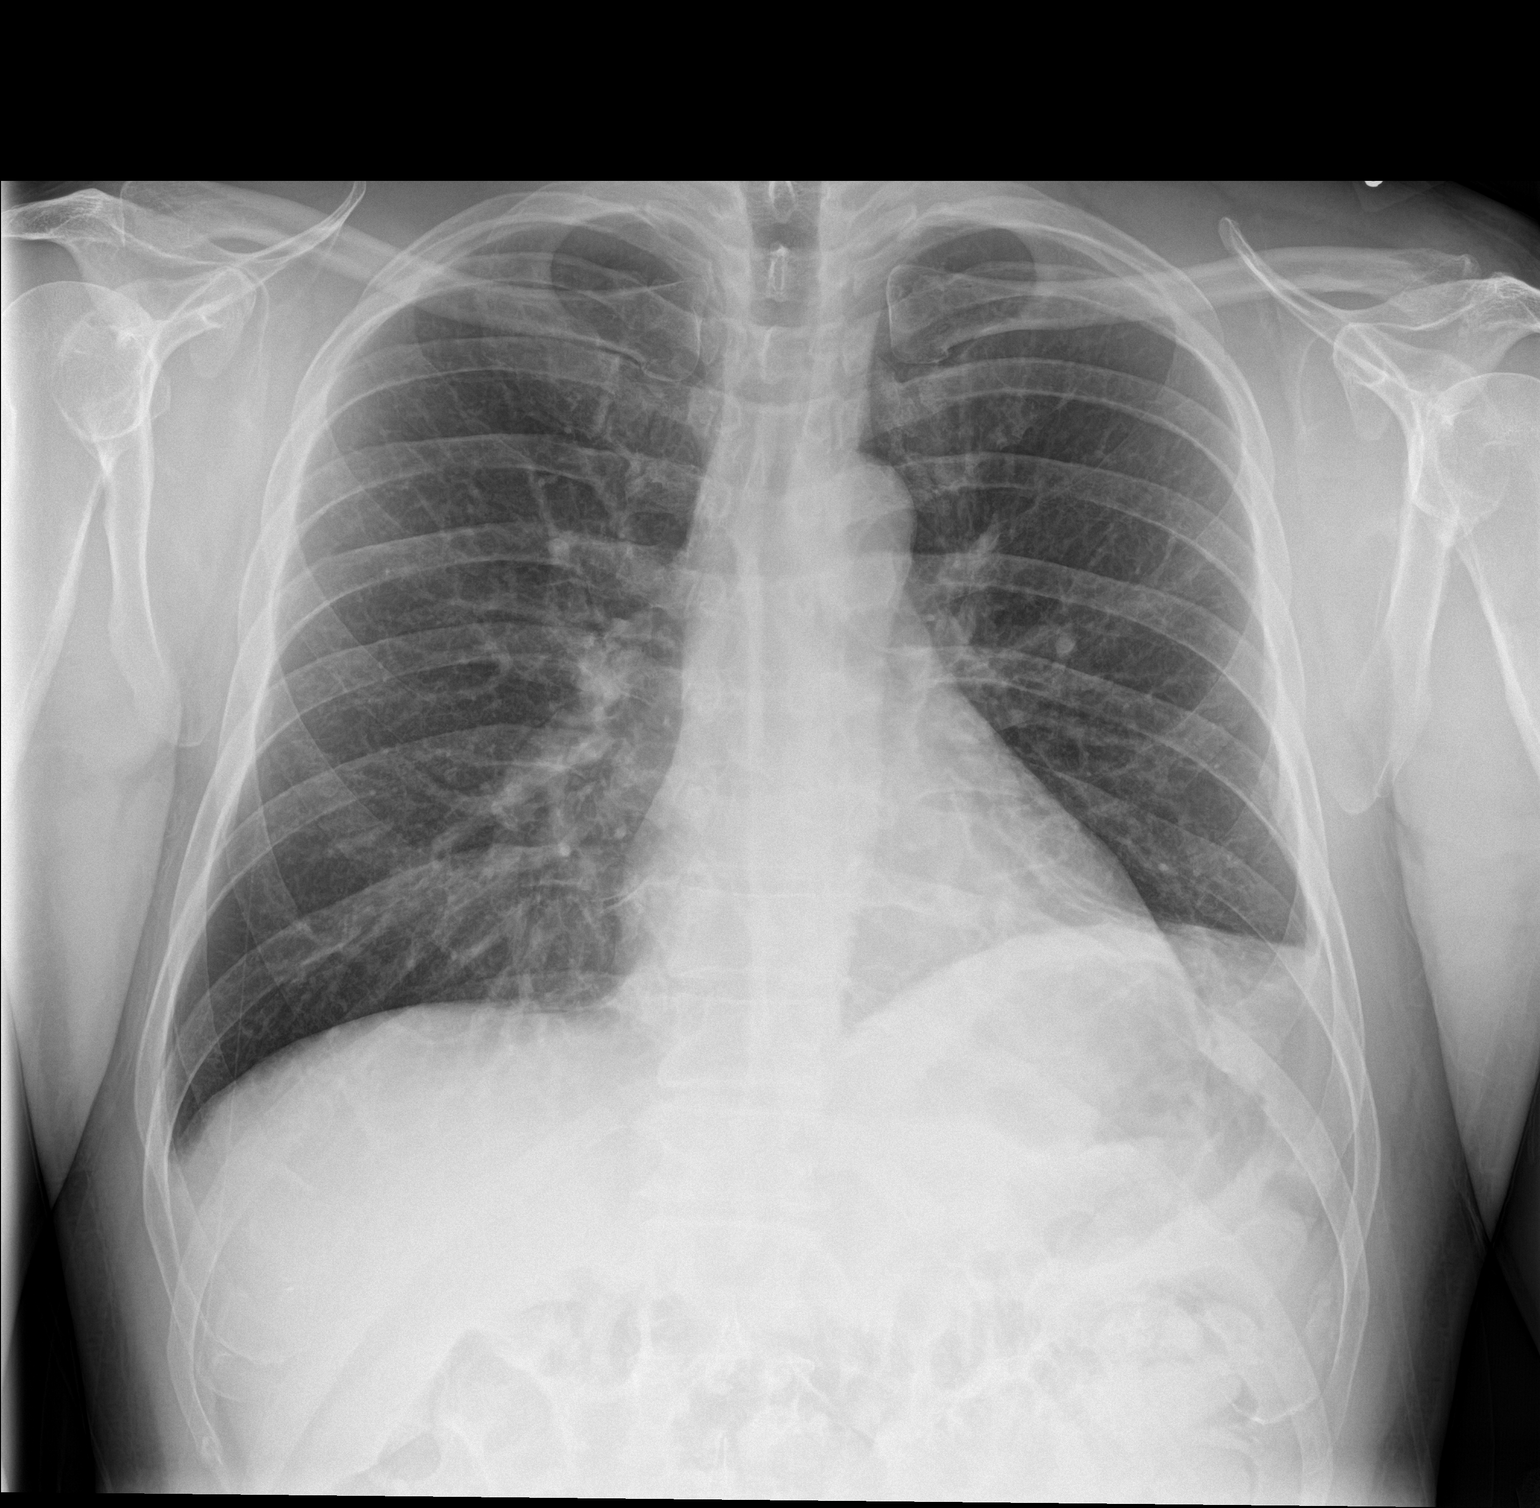

[chest lat]
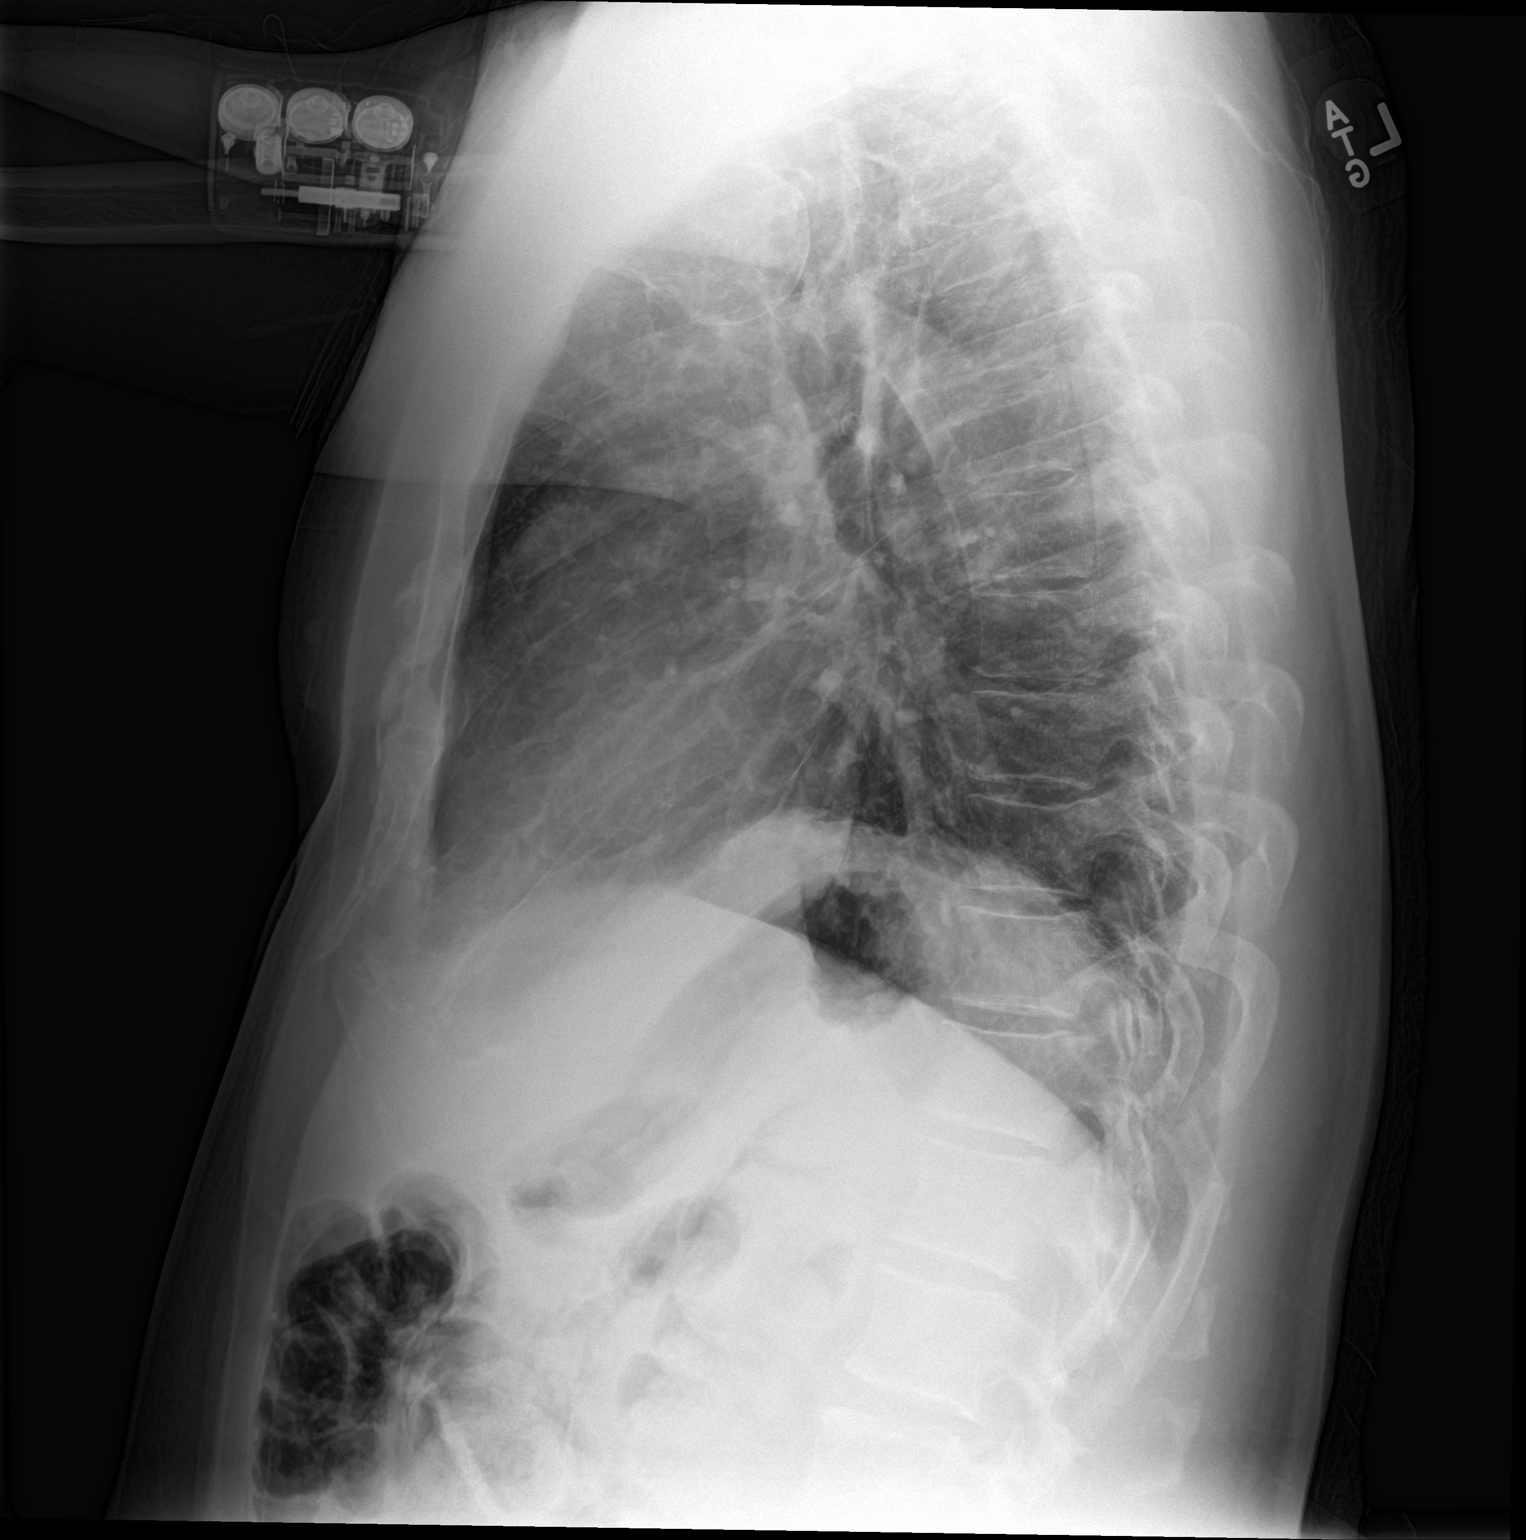

[2 of 2 positions shown; findings below may reference images not displayed]

FINDINGS: The heart size and mediastinal contours are within normal limits.
There is mild atelectasis of the left lung base. There is no focal
pneumonia, pulmonary edema or pleural effusion. The visualized
skeletal structures are stable P
IMPRESSION: No active cardiopulmonary disease.

## 2016-11-24 ENCOUNTER — Inpatient Hospital Stay: Admission: RE | Admit: 2016-11-24 | Payer: Self-pay | Source: Ambulatory Visit

## 2016-11-28 ENCOUNTER — Inpatient Hospital Stay: Admission: RE | Admit: 2016-11-28 | Payer: Self-pay | Source: Ambulatory Visit

## 2016-12-01 ENCOUNTER — Ambulatory Visit
Admission: RE | Admit: 2016-12-01 | Payer: No Typology Code available for payment source | Source: Ambulatory Visit | Admitting: Podiatry

## 2016-12-01 ENCOUNTER — Encounter: Admission: RE | Payer: Self-pay | Source: Ambulatory Visit

## 2016-12-01 SURGERY — KIDNER PROCEDURE, MODIFIED
Anesthesia: Choice | Laterality: Left

## 2017-01-18 ENCOUNTER — Other Ambulatory Visit: Payer: Self-pay | Admitting: Podiatry

## 2017-01-23 ENCOUNTER — Other Ambulatory Visit: Payer: Self-pay

## 2017-01-24 ENCOUNTER — Encounter
Admission: RE | Admit: 2017-01-24 | Discharge: 2017-01-24 | Disposition: A | Discharge status: Home / Self Care | Payer: Non-veteran care | Source: Ambulatory Visit | Attending: Podiatry | Admitting: Podiatry

## 2017-01-24 DIAGNOSIS — Z01818 Encounter for other preprocedural examination: Secondary | ICD-10-CM | POA: Insufficient documentation

## 2017-01-24 DIAGNOSIS — I10 Essential (primary) hypertension: Secondary | ICD-10-CM | POA: Insufficient documentation

## 2017-01-24 HISTORY — DX: Chronic kidney disease, unspecified: N18.9

## 2017-01-24 HISTORY — DX: Cardiac murmur, unspecified: R01.1

## 2017-01-24 HISTORY — DX: Presence of insulin pump (external) (internal): Z96.41

## 2017-01-24 HISTORY — DX: Hypothyroidism, unspecified: E03.9

## 2017-01-24 HISTORY — DX: Type 2 diabetes mellitus with diabetic neuropathy, unspecified: E11.40

## 2017-01-24 LAB — DIFFERENTIAL
BASOS ABS: 0 10*3/uL (ref 0–0.1)
Basophils Relative: 1 %
EOS ABS: 0.2 10*3/uL (ref 0–0.7)
EOS PCT: 3 %
LYMPHS ABS: 1.8 10*3/uL (ref 1.0–3.6)
Lymphocytes Relative: 24 %
Monocytes Absolute: 0.5 10*3/uL (ref 0.2–1.0)
Monocytes Relative: 7 %
NEUTROS PCT: 65 %
Neutro Abs: 4.9 10*3/uL (ref 1.4–6.5)

## 2017-01-24 LAB — BASIC METABOLIC PANEL
Anion gap: 7 (ref 5–15)
BUN: 20 mg/dL (ref 6–20)
CHLORIDE: 100 mmol/L — AB (ref 101–111)
CO2: 28 mmol/L (ref 22–32)
CREATININE: 1.42 mg/dL — AB (ref 0.61–1.24)
Calcium: 9.1 mg/dL (ref 8.9–10.3)
GFR, EST NON AFRICAN AMERICAN: 55 mL/min — AB (ref >60)
Glucose, Bld: 149 mg/dL — ABNORMAL HIGH (ref 65–99)
POTASSIUM: 4.2 mmol/L (ref 3.5–5.1)
SODIUM: 135 mmol/L (ref 135–145)

## 2017-01-24 LAB — CBC
HCT: 38.5 % — ABNORMAL LOW (ref 40.0–52.0)
HEMOGLOBIN: 13.1 g/dL (ref 13.0–18.0)
MCH: 29.8 pg (ref 26.0–34.0)
MCHC: 34 g/dL (ref 32.0–36.0)
MCV: 87.8 fL (ref 80.0–100.0)
PLATELETS: 188 10*3/uL (ref 150–440)
RBC: 4.39 MIL/uL — AB (ref 4.40–5.90)
RDW: 14.4 % (ref 11.5–14.5)
WBC: 7.5 10*3/uL (ref 3.8–10.6)

## 2017-01-24 NOTE — Patient Instructions (Signed)
Your procedure is scheduled on:Feb 02, 2017 (Friday) Report to Same Day Surgery 2nd floor medical mall [facility] Entrance-take elevator on left to 2nd floor.  Check in with surgery information desk.) To find out your arrival time please call [phone] between 1PM - 3PM on Feb 01, 2017 (Thursday)   Remember: Instructions that are not followed completely may result in serious medical risk, up to and including death, or upon the discretion of your surgeon and anesthesiologist your surgery may need to be rescheduled.    _x___ 1. Do not eat food or drink liquids after midnight. No gum chewing or hard candies                           .     __x__ 2. No Alcohol for 24 hours before or after surgery.   __x__3. No Smoking for 24 prior to surgery.   ____  4. Bring all medications with you on the day of surgery if instructed.    __x__ 5. Notify your doctor if there is any change in your medical condition     (cold, fever, infections).     Do not wear jewelry, make-up, hairpins, clips or nail polish.  Do not wear lotions, powders, or perfumes.   Do not shave 48 hours prior to surgery. Men may shave face and neck.  Do not bring valuables to the hospital.    [facility] is not responsible for any belongings or valuables.               Contacts, dentures or bridgework may not be worn into surgery.  Leave your suitcase in the car. After surgery it may be brought to your room.  For patients admitted to the hospital, discharge time is determined by your treatment team                        Patients discharged the day of surgery will not be allowed to drive home.  You will need someone to drive you home and stay with you the night of your procedure.    Please read over the following fact sheets that you were given:   [facility] Preparing for Surgery and or MRSA Information   _x___ Take anti-hypertensive (unless it includes a diuretic), cardiac, seizure, asthma,     anti-reflux and  psychiatric medicines with a sip of water.. These include:  1. LEVOTHYROXINE  2. GABAPENTIN  3. AMLODIPINE  4.  5.  6.  ____Fleets enema or Magnesium Citrate as directed.   _x___ Use CHG Soap or sage wipes as directed on instruction sheet   ____ Use inhalers on the day of surgery and bring to hospital day of surgery  ____ Stop Metformin and Janumet 2 days prior to surgery.    __x__ Take 1/2 of usual insulin dose the night before surgery and none on the morning surgery  (Treat Insulin Pump as usual as if you normally do if you were having surgery).  _x___ Follow recommendations from Cardiologist, Pulmonologist or PCP regarding          stopping Aspirin, Coumadin, Pllavix ,Eliquis, Effient, or Pradaxa, and Pletal. (STOP ASPIRIN ONE WEEK PRIOR TO SURGERY)  X____Stop Anti-inflammatories such as Advil, Aleve, Ibuprofen, Motrin, Naproxen, Naprosyn, Goodies powders or aspirin products.   _x___ Stop supplements until after surgery.  But may continue Vitamin D, Vitamin B,    multivitamin.(STOP FISH OIL NOW)  ____ Bring C-Pap to the hospital.

## 2017-02-01 MED ORDER — CEFAZOLIN SODIUM-DEXTROSE 2-4 GM/100ML-% IV SOLN
2.0000 g | INTRAVENOUS | Status: AC
  Administered 2017-02-02: 2 g via INTRAVENOUS

## 2017-02-02 ENCOUNTER — Encounter: Payer: Self-pay | Admitting: Anesthesiology

## 2017-02-02 ENCOUNTER — Ambulatory Visit: Payer: Non-veteran care | Admitting: Anesthesiology

## 2017-02-02 ENCOUNTER — Observation Stay
Admission: RE | Admit: 2017-02-02 | Discharge: 2017-02-03 | Disposition: A | Discharge status: Home / Self Care | Payer: Non-veteran care | Source: Ambulatory Visit | Attending: Podiatry | Admitting: Podiatry

## 2017-02-02 ENCOUNTER — Encounter
Admission: RE | Disposition: A | Discharge status: Home / Self Care | Payer: Self-pay | Source: Ambulatory Visit | Attending: Podiatry

## 2017-02-02 DIAGNOSIS — Z794 Long term (current) use of insulin: Secondary | ICD-10-CM | POA: Diagnosis not present

## 2017-02-02 DIAGNOSIS — I129 Hypertensive chronic kidney disease with stage 1 through stage 4 chronic kidney disease, or unspecified chronic kidney disease: Secondary | ICD-10-CM | POA: Diagnosis not present

## 2017-02-02 DIAGNOSIS — E871 Hypo-osmolality and hyponatremia: Secondary | ICD-10-CM | POA: Insufficient documentation

## 2017-02-02 DIAGNOSIS — Z7982 Long term (current) use of aspirin: Secondary | ICD-10-CM | POA: Diagnosis not present

## 2017-02-02 DIAGNOSIS — M19072 Primary osteoarthritis, left ankle and foot: Secondary | ICD-10-CM | POA: Insufficient documentation

## 2017-02-02 DIAGNOSIS — E1140 Type 2 diabetes mellitus with diabetic neuropathy, unspecified: Secondary | ICD-10-CM | POA: Insufficient documentation

## 2017-02-02 DIAGNOSIS — E11610 Type 2 diabetes mellitus with diabetic neuropathic arthropathy: Secondary | ICD-10-CM | POA: Diagnosis not present

## 2017-02-02 DIAGNOSIS — Z7951 Long term (current) use of inhaled steroids: Secondary | ICD-10-CM | POA: Insufficient documentation

## 2017-02-02 DIAGNOSIS — E1122 Type 2 diabetes mellitus with diabetic chronic kidney disease: Secondary | ICD-10-CM | POA: Insufficient documentation

## 2017-02-02 DIAGNOSIS — E0590 Thyrotoxicosis, unspecified without thyrotoxic crisis or storm: Secondary | ICD-10-CM | POA: Insufficient documentation

## 2017-02-02 DIAGNOSIS — E785 Hyperlipidemia, unspecified: Secondary | ICD-10-CM | POA: Insufficient documentation

## 2017-02-02 DIAGNOSIS — G8918 Other acute postprocedural pain: Secondary | ICD-10-CM | POA: Diagnosis present

## 2017-02-02 DIAGNOSIS — F17290 Nicotine dependence, other tobacco product, uncomplicated: Secondary | ICD-10-CM | POA: Diagnosis not present

## 2017-02-02 DIAGNOSIS — N189 Chronic kidney disease, unspecified: Secondary | ICD-10-CM | POA: Insufficient documentation

## 2017-02-02 DIAGNOSIS — Z79899 Other long term (current) drug therapy: Secondary | ICD-10-CM | POA: Insufficient documentation

## 2017-02-02 DIAGNOSIS — E039 Hypothyroidism, unspecified: Secondary | ICD-10-CM | POA: Insufficient documentation

## 2017-02-02 DIAGNOSIS — Z9641 Presence of insulin pump (external) (internal): Secondary | ICD-10-CM | POA: Insufficient documentation

## 2017-02-02 HISTORY — PX: FOOT ARTHRODESIS: SHX1655

## 2017-02-02 HISTORY — PX: TRANSMETATARSAL FUSION: SHX6632

## 2017-02-02 HISTORY — PX: ACHILLES TENDON SURGERY: SHX542

## 2017-02-02 LAB — CBC
HCT: 35.9 % — ABNORMAL LOW (ref 40.0–52.0)
Hemoglobin: 12.3 g/dL — ABNORMAL LOW (ref 13.0–18.0)
MCH: 30 pg (ref 26.0–34.0)
MCHC: 34.2 g/dL (ref 32.0–36.0)
MCV: 87.9 fL (ref 80.0–100.0)
Platelets: 190 10*3/uL (ref 150–440)
RBC: 4.09 MIL/uL — ABNORMAL LOW (ref 4.40–5.90)
RDW: 14.4 % (ref 11.5–14.5)
WBC: 7.1 10*3/uL (ref 3.8–10.6)

## 2017-02-02 LAB — GLUCOSE, CAPILLARY
Glucose-Capillary: 247 mg/dL — ABNORMAL HIGH (ref 65–99)
Glucose-Capillary: 234 mg/dL — ABNORMAL HIGH (ref 65–99)
Glucose-Capillary: 273 mg/dL — ABNORMAL HIGH (ref 65–99)
Glucose-Capillary: 335 mg/dL — ABNORMAL HIGH (ref 65–99)
Glucose-Capillary: 360 mg/dL — ABNORMAL HIGH (ref 65–99)
Glucose-Capillary: 224 mg/dL — ABNORMAL HIGH (ref 65–99)
Glucose-Capillary: 229 mg/dL — ABNORMAL HIGH (ref 65–99)
Glucose-Capillary: 334 mg/dL — ABNORMAL HIGH (ref 65–99)

## 2017-02-02 LAB — CREATININE, SERUM
Creatinine, Ser: 1.91 mg/dL — ABNORMAL HIGH (ref 0.61–1.24)
GFR calc Af Amer: 45 mL/min — ABNORMAL LOW (ref >60)
GFR calc non Af Amer: 38 mL/min — ABNORMAL LOW (ref >60)

## 2017-02-02 SURGERY — KIDNER PROCEDURE, MODIFIED
Anesthesia: General | Laterality: Left

## 2017-02-02 MED ORDER — CHLORHEXIDINE GLUCONATE 4 % EX LIQD: 60.0000 mL | Freq: Once | CUTANEOUS | Status: DC

## 2017-02-02 MED ORDER — CEFAZOLIN SODIUM 1 G IJ SOLR
INTRAMUSCULAR | Status: DC | PRN
  Administered 2017-02-02: 2 g via INTRAMUSCULAR

## 2017-02-02 MED ORDER — FENTANYL CITRATE (PF) 100 MCG/2ML IJ SOLN
INTRAMUSCULAR | Status: AC
  Administered 2017-02-02: 50 ug via INTRAVENOUS
  Filled 2017-02-02: qty 2

## 2017-02-02 MED ORDER — GLYCOPYRROLATE 0.2 MG/ML IJ SOLN
INTRAMUSCULAR | Status: DC | PRN
  Administered 2017-02-02: 0.2 mg via INTRAVENOUS

## 2017-02-02 MED ORDER — LORATADINE 10 MG PO TABS
10.0000 mg | ORAL_TABLET | Freq: Every day | ORAL | Status: DC
  Administered 2017-02-02 – 2017-02-03 (×2): 10 mg via ORAL
  Filled 2017-02-02 (×2): qty 1

## 2017-02-02 MED ORDER — LIDOCAINE HCL (PF) 2 % IJ SOLN
INTRAMUSCULAR | Status: AC
  Filled 2017-02-02: qty 2

## 2017-02-02 MED ORDER — FINASTERIDE 5 MG PO TABS
5.0000 mg | ORAL_TABLET | Freq: Every day | ORAL | Status: DC
  Administered 2017-02-02 – 2017-02-03 (×2): 5 mg via ORAL
  Filled 2017-02-02 (×2): qty 1

## 2017-02-02 MED ORDER — OXYCODONE HCL 5 MG PO TABS
5.0000 mg | ORAL_TABLET | ORAL | Status: DC | PRN
  Administered 2017-02-02 – 2017-02-03 (×2): 5 mg via ORAL

## 2017-02-02 MED ORDER — EPHEDRINE SULFATE 50 MG/ML IJ SOLN
INTRAMUSCULAR | Status: DC | PRN
  Administered 2017-02-02 (×4): 5 mg via INTRAVENOUS

## 2017-02-02 MED ORDER — AMLODIPINE BESYLATE 5 MG PO TABS
5.0000 mg | ORAL_TABLET | Freq: Every day | ORAL | Status: DC
  Administered 2017-02-03: 5 mg via ORAL
  Filled 2017-02-02: qty 1

## 2017-02-02 MED ORDER — FENTANYL CITRATE (PF) 100 MCG/2ML IJ SOLN
INTRAMUSCULAR | Status: DC | PRN
  Administered 2017-02-02: 50 ug via INTRAVENOUS
  Administered 2017-02-02: 100 ug via INTRAVENOUS
  Administered 2017-02-02 (×2): 50 ug via INTRAVENOUS

## 2017-02-02 MED ORDER — PHENYLEPHRINE HCL 10 MG/ML IJ SOLN
INTRAMUSCULAR | Status: DC | PRN
  Administered 2017-02-02 (×8): 100 ug via INTRAVENOUS

## 2017-02-02 MED ORDER — SEVOFLURANE IN SOLN
RESPIRATORY_TRACT | Status: AC
  Filled 2017-02-02: qty 250

## 2017-02-02 MED ORDER — SODIUM CHLORIDE 0.9 % IV SOLN
INTRAVENOUS | Status: DC
  Administered 2017-02-02 (×2): via INTRAVENOUS

## 2017-02-02 MED ORDER — LIDOCAINE-EPINEPHRINE 1 %-1:100000 IJ SOLN
INTRAMUSCULAR | Status: DC | PRN
  Administered 2017-02-02: 10 mL

## 2017-02-02 MED ORDER — DEXAMETHASONE SODIUM PHOSPHATE 10 MG/ML IJ SOLN
INTRAMUSCULAR | Status: AC
  Filled 2017-02-02: qty 1

## 2017-02-02 MED ORDER — ROPIVACAINE HCL 5 MG/ML IJ SOLN
INTRAMUSCULAR | Status: AC
  Filled 2017-02-02: qty 30

## 2017-02-02 MED ORDER — INSULIN ASPART 100 UNIT/ML ~~LOC~~ SOLN
SUBCUTANEOUS | Status: AC
  Administered 2017-02-02: 8 [IU] via SUBCUTANEOUS
  Filled 2017-02-02: qty 8

## 2017-02-02 MED ORDER — MORPHINE SULFATE (PF) 2 MG/ML IV SOLN
2.0000 mg | INTRAVENOUS | Status: DC | PRN
  Administered 2017-02-03: 2 mg via INTRAVENOUS
  Filled 2017-02-02: qty 1

## 2017-02-02 MED ORDER — TAMSULOSIN HCL 0.4 MG PO CAPS
0.8000 mg | ORAL_CAPSULE | Freq: Every day | ORAL | Status: DC
  Administered 2017-02-02: 0.8 mg via ORAL
  Filled 2017-02-02: qty 2

## 2017-02-02 MED ORDER — ALBUTEROL SULFATE (2.5 MG/3ML) 0.083% IN NEBU: 2.5000 mg | INHALATION_SOLUTION | RESPIRATORY_TRACT | Status: DC | PRN

## 2017-02-02 MED ORDER — FAMOTIDINE 20 MG PO TABS
20.0000 mg | ORAL_TABLET | Freq: Once | ORAL | Status: AC
  Administered 2017-02-02: 20 mg via ORAL

## 2017-02-02 MED ORDER — FENTANYL CITRATE (PF) 250 MCG/5ML IJ SOLN
INTRAMUSCULAR | Status: AC
  Filled 2017-02-02: qty 5

## 2017-02-02 MED ORDER — EPHEDRINE SULFATE 50 MG/ML IJ SOLN
INTRAMUSCULAR | Status: AC
  Filled 2017-02-02: qty 1

## 2017-02-02 MED ORDER — INSULIN ASPART 100 UNIT/ML ~~LOC~~ SOLN
SUBCUTANEOUS | Status: DC | PRN
  Administered 2017-02-02: 8 [IU] via SUBCUTANEOUS

## 2017-02-02 MED ORDER — SILDENAFIL CITRATE 100 MG PO TABS: 100.0000 mg | ORAL_TABLET | Freq: Every day | ORAL | Status: DC | PRN

## 2017-02-02 MED ORDER — SUCCINYLCHOLINE CHLORIDE 20 MG/ML IJ SOLN
INTRAMUSCULAR | Status: AC
  Filled 2017-02-02: qty 1

## 2017-02-02 MED ORDER — MIDAZOLAM HCL 2 MG/2ML IJ SOLN
INTRAMUSCULAR | Status: DC | PRN
  Administered 2017-02-02: 2 mg via INTRAVENOUS

## 2017-02-02 MED ORDER — PROPOFOL 500 MG/50ML IV EMUL
INTRAVENOUS | Status: AC
  Filled 2017-02-02: qty 50

## 2017-02-02 MED ORDER — LIDOCAINE HCL (PF) 1 % IJ SOLN
INTRAMUSCULAR | Status: AC
  Filled 2017-02-02: qty 30

## 2017-02-02 MED ORDER — MIDAZOLAM HCL 2 MG/2ML IJ SOLN
INTRAMUSCULAR | Status: AC
  Administered 2017-02-02: 1 mg via INTRAVENOUS
  Filled 2017-02-02: qty 2

## 2017-02-02 MED ORDER — SODIUM CHLORIDE 0.9 % IV SOLN
INTRAVENOUS | Status: DC | PRN
  Administered 2017-02-02: 5 [IU] via INTRAVENOUS

## 2017-02-02 MED ORDER — FENTANYL CITRATE (PF) 100 MCG/2ML IJ SOLN: 25.0000 ug | INTRAMUSCULAR | Status: DC | PRN

## 2017-02-02 MED ORDER — DEXAMETHASONE SODIUM PHOSPHATE 10 MG/ML IJ SOLN
INTRAMUSCULAR | Status: DC | PRN
  Administered 2017-02-02: 5 mg via INTRAVENOUS

## 2017-02-02 MED ORDER — CEFAZOLIN SODIUM 1 G IJ SOLR
INTRAMUSCULAR | Status: AC
  Filled 2017-02-02: qty 20

## 2017-02-02 MED ORDER — GLYCOPYRROLATE 0.2 MG/ML IJ SOLN
INTRAMUSCULAR | Status: AC
  Filled 2017-02-02: qty 1

## 2017-02-02 MED ORDER — MICROFIBRILLAR COLL HEMOSTAT EX PADS
MEDICATED_PAD | CUTANEOUS | Status: DC | PRN
  Administered 2017-02-02: 2 via TOPICAL

## 2017-02-02 MED ORDER — INSULIN ASPART 100 UNIT/ML ~~LOC~~ SOLN
SUBCUTANEOUS | Status: AC
Start: 2017-02-02 — End: 2017-02-02
  Filled 2017-02-02: qty 1

## 2017-02-02 MED ORDER — FENTANYL CITRATE (PF) 100 MCG/2ML IJ SOLN
50.0000 ug | Freq: Once | INTRAMUSCULAR | Status: AC
  Administered 2017-02-02: 50 ug via INTRAVENOUS

## 2017-02-02 MED ORDER — BUPIVACAINE HCL (PF) 0.25 % IJ SOLN
INTRAMUSCULAR | Status: DC | PRN
  Administered 2017-02-02: 20 mL

## 2017-02-02 MED ORDER — OXYCODONE HCL 5 MG PO TABS: 5.0000 mg | ORAL_TABLET | Freq: Once | ORAL | Status: DC | PRN

## 2017-02-02 MED ORDER — ONDANSETRON HCL 4 MG/2ML IJ SOLN
INTRAMUSCULAR | Status: AC
  Filled 2017-02-02: qty 2

## 2017-02-02 MED ORDER — OXYCODONE HCL 5 MG PO TABS
5.0000 mg | ORAL_TABLET | ORAL | Status: DC | PRN
  Filled 2017-02-02 (×2): qty 1

## 2017-02-02 MED ORDER — GABAPENTIN 300 MG PO CAPS
600.0000 mg | ORAL_CAPSULE | Freq: Two times a day (BID) | ORAL | Status: DC
  Administered 2017-02-02 – 2017-02-03 (×2): 600 mg via ORAL
  Filled 2017-02-02 (×2): qty 2

## 2017-02-02 MED ORDER — LIDOCAINE HCL (PF) 1 % IJ SOLN
INTRAMUSCULAR | Status: DC | PRN
  Administered 2017-02-02: 1 mL via INTRADERMAL

## 2017-02-02 MED ORDER — LIDOCAINE HCL (CARDIAC) 20 MG/ML IV SOLN
INTRAVENOUS | Status: DC | PRN
  Administered 2017-02-02: 50 mg via INTRAVENOUS

## 2017-02-02 MED ORDER — LIDOCAINE HCL (PF) 1 % IJ SOLN
INTRAMUSCULAR | Status: AC
  Filled 2017-02-02: qty 5

## 2017-02-02 MED ORDER — BUPIVACAINE HCL (PF) 0.5 % IJ SOLN
INTRAMUSCULAR | Status: AC
  Filled 2017-02-02: qty 30

## 2017-02-02 MED ORDER — FAMOTIDINE 20 MG PO TABS
ORAL_TABLET | ORAL | Status: AC
  Filled 2017-02-02: qty 1

## 2017-02-02 MED ORDER — LEVOTHYROXINE SODIUM 50 MCG PO TABS
175.0000 ug | ORAL_TABLET | Freq: Every day | ORAL | Status: DC
  Administered 2017-02-03: 175 ug via ORAL
  Filled 2017-02-02: qty 1

## 2017-02-02 MED ORDER — ROPIVACAINE HCL 5 MG/ML IJ SOLN
INTRAMUSCULAR | Status: DC | PRN
  Administered 2017-02-02: 10 mL via PERINEURAL
  Administered 2017-02-02: 20 mL via PERINEURAL

## 2017-02-02 MED ORDER — ATORVASTATIN CALCIUM 20 MG PO TABS
40.0000 mg | ORAL_TABLET | Freq: Every day | ORAL | Status: DC
  Administered 2017-02-02: 40 mg via ORAL
  Filled 2017-02-02: qty 2

## 2017-02-02 MED ORDER — ENOXAPARIN SODIUM 40 MG/0.4ML ~~LOC~~ SOLN
40.0000 mg | SUBCUTANEOUS | Status: DC
  Administered 2017-02-03: 40 mg via SUBCUTANEOUS
  Filled 2017-02-02: qty 0.4

## 2017-02-02 MED ORDER — BUPIVACAINE HCL (PF) 0.25 % IJ SOLN
INTRAMUSCULAR | Status: AC
  Filled 2017-02-02: qty 30

## 2017-02-02 MED ORDER — SUCCINYLCHOLINE CHLORIDE 20 MG/ML IJ SOLN
INTRAMUSCULAR | Status: DC | PRN
  Administered 2017-02-02: 100 mg via INTRAVENOUS

## 2017-02-02 MED ORDER — ASPIRIN EC 81 MG PO TBEC
81.0000 mg | DELAYED_RELEASE_TABLET | Freq: Every day | ORAL | Status: DC
  Administered 2017-02-02 – 2017-02-03 (×2): 81 mg via ORAL
  Filled 2017-02-02 (×2): qty 1

## 2017-02-02 MED ORDER — PROPOFOL 10 MG/ML IV BOLUS
INTRAVENOUS | Status: DC | PRN
  Administered 2017-02-02: 200 mg via INTRAVENOUS

## 2017-02-02 MED ORDER — ONDANSETRON HCL 4 MG/2ML IJ SOLN
INTRAMUSCULAR | Status: DC | PRN
  Administered 2017-02-02: 4 mg via INTRAVENOUS

## 2017-02-02 MED ORDER — LIDOCAINE-EPINEPHRINE 1 %-1:100000 IJ SOLN
INTRAMUSCULAR | Status: AC
  Filled 2017-02-02: qty 1

## 2017-02-02 MED ORDER — MIDAZOLAM HCL 2 MG/2ML IJ SOLN
1.0000 mg | Freq: Once | INTRAMUSCULAR | Status: AC
  Administered 2017-02-02: 1 mg via INTRAVENOUS

## 2017-02-02 MED ORDER — SODIUM CHLORIDE 0.9 % IV SOLN: INTRAVENOUS | Status: DC

## 2017-02-02 MED ORDER — NEOMYCIN-POLYMYXIN B GU 40-200000 IR SOLN
Status: AC
  Filled 2017-02-02: qty 4

## 2017-02-02 MED ORDER — OXYCODONE HCL 5 MG/5ML PO SOLN: 5.0000 mg | Freq: Once | ORAL | Status: DC | PRN

## 2017-02-02 MED ORDER — ROCURONIUM BROMIDE 50 MG/5ML IV SOLN
INTRAVENOUS | Status: AC
  Filled 2017-02-02: qty 1

## 2017-02-02 MED ORDER — CEFAZOLIN SODIUM-DEXTROSE 2-4 GM/100ML-% IV SOLN
INTRAVENOUS | Status: AC
  Filled 2017-02-02: qty 100

## 2017-02-02 MED ORDER — ROCURONIUM BROMIDE 100 MG/10ML IV SOLN
INTRAVENOUS | Status: DC | PRN
  Administered 2017-02-02: 10 mg via INTRAVENOUS
  Administered 2017-02-02: 30 mg via INTRAVENOUS
  Administered 2017-02-02: 10 mg via INTRAVENOUS

## 2017-02-02 SURGICAL SUPPLY — 102 items
BANDAGE ELASTIC 4 LF NS (GAUZE/BANDAGES/DRESSINGS) ×4 IMPLANT
BANDAGE STRETCH 3X4.1 STRL (GAUZE/BANDAGES/DRESSINGS) ×2 IMPLANT
BIT DRILL 2 FENESTRATED (MISCELLANEOUS) ×1 IMPLANT
BIT DRILL 2.4X140 LONG SOLID (BIT) ×2 IMPLANT
BIT DRILL CANNULTD 2.6 X 130MM (MISCELLANEOUS) ×2 IMPLANT
BIT DRILLL 2 FENESTRATED (MISCELLANEOUS) ×1
BLADE MED AGGRESSIVE (BLADE) ×2 IMPLANT
BLADE OSCILLATING/SAGITTAL (BLADE) ×1
BLADE SURG 15 STRL LF DISP TIS (BLADE) ×5 IMPLANT
BLADE SURG 15 STRL SS (BLADE) ×5
BLADE SW THK.38XMED LNG THN (BLADE) ×1 IMPLANT
BNDG COHESIVE 4X5 TAN STRL (GAUZE/BANDAGES/DRESSINGS) ×4 IMPLANT
BNDG ESMARK 4X12 TAN STRL LF (GAUZE/BANDAGES/DRESSINGS) ×2 IMPLANT
BNDG GAUZE 4.5X4.1 6PLY STRL (MISCELLANEOUS) ×2 IMPLANT
BOWL ×2 IMPLANT
BUR 4X45 EGG (BURR) ×2 IMPLANT
CANISTER SUCT 1200ML W/VALVE (MISCELLANEOUS) ×2 IMPLANT
COUNTERSICK 4.0 HEADED (MISCELLANEOUS) ×2
COVER PIN YLW 0.028-062 (MISCELLANEOUS) IMPLANT
CUFF TOURN 18 STER (MISCELLANEOUS) IMPLANT
CUFF TOURN 30 STER DUAL PORT (MISCELLANEOUS) ×2 IMPLANT
CUFF TOURN SGL QUICK 24 (TOURNIQUET CUFF)
CUFF TRNQT CYL 24X4X40X1 (TOURNIQUET CUFF) IMPLANT
DRAPE C-ARM XRAY 36X54 (DRAPES) ×2 IMPLANT
DRAPE C-ARMOR (DRAPES) ×2 IMPLANT
DRILL CANNULATED 2.6 X 130MM (MISCELLANEOUS) ×4
DURAPREP 26ML APPLICATOR (WOUND CARE) ×4 IMPLANT
ELECT REM PT RETURN 9FT ADLT (ELECTROSURGICAL) ×2
ELECTRODE REM PT RTRN 9FT ADLT (ELECTROSURGICAL) ×1 IMPLANT
ESU TIP ×2 IMPLANT
GAUZE PETRO XEROFOAM 1X8 (MISCELLANEOUS) ×2 IMPLANT
GAUZE SPONGE 4X4 12PLY STRL (GAUZE/BANDAGES/DRESSINGS) ×2 IMPLANT
GAUZE STRETCH 2X75IN STRL (MISCELLANEOUS) ×2 IMPLANT
GAUZE XEROFORM 4X4 STRL (GAUZE/BANDAGES/DRESSINGS) ×2 IMPLANT
GLOVE BIO SURGEON STRL SZ7.5 (GLOVE) ×2 IMPLANT
GLOVE INDICATOR 8.0 STRL GRN (GLOVE) ×2 IMPLANT
GOWN STRL REUS W/ TWL LRG LVL3 (GOWN DISPOSABLE) ×2 IMPLANT
GOWN STRL REUS W/TWL LRG LVL3 (GOWN DISPOSABLE) ×2
GRAFT TRINITY ELITE LGE HUMAN (Tissue) ×2 IMPLANT
K-WIRE SMOOTH 1.6X150MM (WIRE) ×8
K-WIRE SMOOTH TROCAR 2.0X150 (WIRE) ×8
K-WIRE SNGL END 1.2X150 (MISCELLANEOUS) ×16
KIT RM TURNOVER STRD PROC AR (KITS) ×2 IMPLANT
KWIRE SMOOTH 1.6X150MM (WIRE) ×4 IMPLANT
KWIRE SMOOTH TROCAR 2.0X150 (WIRE) ×4 IMPLANT
KWIRE SNGL END 1.2X150 (MISCELLANEOUS) ×8 IMPLANT
NEEDLE FILTER BLUNT 18X 1/2SAF (NEEDLE) ×1
NEEDLE FILTER BLUNT 18X1 1/2 (NEEDLE) ×1 IMPLANT
NEEDLE HYPO 25X1 1.5 SAFETY (NEEDLE) ×4 IMPLANT
NS IRRIG 1000ML POUR BTL (IV SOLUTION) ×2 IMPLANT
PACK EXTREMITY ARMC (MISCELLANEOUS) ×2 IMPLANT
PADDING CAST BLEND 4X4 NS (MISCELLANEOUS) ×2 IMPLANT
PLATE 4H SLANT RIGHT (Plate) ×2 IMPLANT
PLATE 4HOLE STANDARD LT (Plate) ×2 IMPLANT
PLATE 4HOLE STD LT (Plate) ×1 IMPLANT
PLATE STRAIGHT 4-HOLE (Plate) ×2 IMPLANT
RASP SM TEAR CROSS CUT (RASP) ×2 IMPLANT
SCREW 4.0X30MM (Screw) ×1 IMPLANT
SCREW BN ST 30X4XCANN HD (Screw) ×1 IMPLANT
SCREW CANN 4.0X38 SHORT THREAD (Screw) ×2 IMPLANT
SCREW CANN 4.0X40 SHORT THREAD (Screw) ×4 IMPLANT
SCREW CANN 4.0X42 SHORT THREAD (Screw) ×2 IMPLANT
SCREW CANN 4.0X48 SHORT THREAD (Screw) ×2 IMPLANT
SCREW CANN.SHORT HEAD 4.0X32MM (Screw) ×4 IMPLANT
SCREW COUNTERSINK 4.0 HEADED (MISCELLANEOUS) ×1 IMPLANT
SCREW LOCK PLATE R3 3.5X16 (Screw) ×6 IMPLANT
SCREW LOCK PLATE R3 3.5X20 (Screw) ×4 IMPLANT
SCREW LOCK PLATE R3 3.5X22 (Screw) ×2 IMPLANT
SCREW LOCK PLATE R3 3.5X24 (Screw) ×6 IMPLANT
SCREW LOCK PLATE R3 3.5X26 (Screw) ×4 IMPLANT
SCREW LOCK PLATE R3 3.5X30 (Screw) ×2 IMPLANT
SCREW NLCK 28X3.5XNS R3CON NON (Screw) ×1 IMPLANT
SCREW NONLOCK 3.5X28 (Screw) ×1 IMPLANT
SCREW NONLOCK 3.5X34 (Screw) ×2 IMPLANT
SPLINT CAST 1 STEP 4X30 (MISCELLANEOUS) ×2 IMPLANT
SPLINT FAST PLASTER 5X30 (CAST SUPPLIES) ×10
SPLINT PLASTER CAST FAST 5X30 (CAST SUPPLIES) ×10 IMPLANT
SPONGE LAP 18X18 5 PK (GAUZE/BANDAGES/DRESSINGS) ×6 IMPLANT
STIMULATOR BONE (ORTHOPEDIC SUPPLIES) ×2
STIMULATOR BONE GROWTH EMG EXT (ORTHOPEDIC SUPPLIES) ×2 IMPLANT
STOCKINETTE M/LG 89821 (MISCELLANEOUS) ×2 IMPLANT
STRIP CLOSURE SKIN 1/4X4 (GAUZE/BANDAGES/DRESSINGS) ×2 IMPLANT
SUT ETHILON 3-0 (SUTURE) ×8 IMPLANT
SUT ETHILON 3-0 FS-10 30 BLK (SUTURE) ×2
SUT ETHILON 4-0 (SUTURE) ×1
SUT ETHILON 4-0 FS2 18XMFL BLK (SUTURE) ×1
SUT VIC AB 2-0 CT1 27 (SUTURE) ×2
SUT VIC AB 2-0 CT1 TAPERPNT 27 (SUTURE) ×2 IMPLANT
SUT VIC AB 2-0 CT2 27 (SUTURE) ×2 IMPLANT
SUT VIC AB 3-0 SH 27 (SUTURE) ×3
SUT VIC AB 3-0 SH 27X BRD (SUTURE) ×3 IMPLANT
SUT VIC AB 4-0 FS2 27 (SUTURE) ×2 IMPLANT
SUT VICRYL+ 3-0 36IN CT-1 (SUTURE) ×4 IMPLANT
SUTURE EHLN 3-0 FS-10 30 BLK (SUTURE) ×1 IMPLANT
SUTURE ETHLN 4-0 FS2 18XMF BLK (SUTURE) ×1 IMPLANT
SYRINGE 10CC LL (SYRINGE) ×2 IMPLANT
TOWEL OR 17X26 4PK STRL BLUE (TOWEL DISPOSABLE) ×2 IMPLANT
TRAY FOLEY CATH SILVER 16FR LF (SET/KITS/TRAYS/PACK) ×2 IMPLANT
WATER STERILE IRR 1000ML POUR (IV SOLUTION) ×2 IMPLANT
WIRE OLIVE SMOOTH 1.4MMX60MM (WIRE) ×8 IMPLANT
WIRE Z .062 C-WIRE SPADE TIP (WIRE) IMPLANT
X-RAY DETECTABLE GAUZE ×4 IMPLANT

## 2017-02-02 NOTE — Op Note (Deleted)
Jeffrey Medina is an 56 y.o. male.   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 Patient is on by mouth pain medicines at home. Will admit patient for evaluation postoperatively and postoperative pain management.  Past Medical History:  Diagnosis Date  . Cellulitis    left ankle  . Chronic kidney disease    Renal Insufficiency  . Diabetes mellitus without complication (HCC)   . Heart murmur   . Hypertension   . Hyperthyroidism    in the past  . Hypothyroidism   . Insulin pump in place    Omnipod Insulin Pump  . Neuropathy in diabetes [facility](HCC)     Past Surgical History:  Procedure Laterality Date  . ANKLE RECONSTRUCTION Right 1987  . BACK SURGERY    . CARDIAC CATHETERIZATION    . NECK SURGERY     Cervical Fusion  . SHOULDER SURGERY Bilateral     Family History  Problem Relation Age of Onset  . Diabetes Father    Social History:  reports that he has never smoked. His smokeless tobacco use includes Snuff. He reports that he does not drink alcohol or use drugs.  Allergies:  Allergies  Allergen Reactions  . Hctz [Hydrochlorothiazide] Other (See Comments)    "Adverse Reaction to HCTZ--Sodium Level dropped to critical low"  . Tylenol [Acetaminophen] Other (See Comments)    "affect blood sugar readings."    ROS  Medications Prior to Admission  Medication Sig Dispense Refill  . albuterol (PROVENTIL) (2.5 MG/3ML) 0.083% nebulizer solution Take 3 mLs (2.5 mg total) by nebulization every 4 (four) hours as needed for wheezing or shortness of breath. 75 mL 0  . amLODipine (NORVASC) 10 MG tablet Take 5 mg by mouth daily.     Marland Kitchen. aspirin EC 81 MG tablet Take 81 mg by mouth daily.     Marland Kitchen. atorvastatin (LIPITOR) 80 MG tablet Take 40 mg by mouth daily at 6 PM.     . cetirizine (ZYRTEC) 10 MG tablet Take 10 mg by mouth daily as needed for allergies.     . finasteride (PROSCAR) 5 MG tablet  Take 5 mg by mouth daily.    Marland Kitchen. gabapentin (NEURONTIN) 300 MG capsule Take 600 mg by mouth 2 (two) times daily.     . insulin aspart (NOVOLOG) 100 UNIT/ML injection Uses in OMNIPOD Insulin Pump    . Insulin Disposable Pump (OMNIPOD) MISC USE AS DIRECTED, CHANGING EVERY 2 TO 3 DAYS    . levothyroxine (SYNTHROID, LEVOTHROID) 175 MCG tablet Take 175 mcg by mouth daily before breakfast.    . Omega-3 Fatty Acids (FISH OIL) 1200 MG CAPS Take 1 capsule by mouth daily.    Marland Kitchen. oxyCODONE (OXY IR/ROXICODONE) 5 MG immediate release tablet Take 5 mg by mouth every 4 (four) hours as needed for severe pain.    . tamsulosin (FLOMAX) 0.4 MG CAPS capsule Take 0.8 mg by mouth at bedtime.     . sildenafil (VIAGRA) 100 MG tablet Take 100 mg by mouth daily as needed for erectile dysfunction.      Physical Exam: General: Alert and oriented.  No apparent distress. Vascular:  Left foot:Dorsalis Pedis:  present Posterior Tibial:  present  Right foot: Dorsalis Pedis:  present Posterior Tibial:  present Neuro:absent protective sensation Derm: Patient status post left reconstruction with dorsal incisions and Achilles tendon lengthening with posterior Achilles incisions. No open ulcerations. Ortho/MS: Left foot is splinted  at this time. Right foot without issues.   Results for orders placed or performed during the hospital encounter of 02/02/17 (from the past 48 hour(s))  Glucose, capillary     Status: Abnormal   Collection Time: 02/02/17 10:02 AM  Result Value Ref Range   Glucose-Capillary 247 (H) 65 - 99 mg/dL  Glucose, capillary     Status: Abnormal   Collection Time: 02/02/17 11:18 AM  Result Value Ref Range   Glucose-Capillary 234 (H) 65 - 99 mg/dL  Glucose, capillary     Status: Abnormal   Collection Time: 02/02/17 12:33 PM  Result Value Ref Range   Glucose-Capillary 273 (H) 65 - 99 mg/dL  Glucose, capillary     Status: Abnormal   Collection Time: 02/02/17  2:35 PM  Result Value Ref Range    Glucose-Capillary 360 (H) 65 - 99 mg/dL  Glucose, capillary     Status: Abnormal   Collection Time: 02/02/17  3:13 PM  Result Value Ref Range   Glucose-Capillary 335 (H) 65 - 99 mg/dL  Glucose, capillary     Status: Abnormal   Collection Time: 02/02/17  5:51 PM  Result Value Ref Range   Glucose-Capillary 224 (H) 65 - 99 mg/dL   No results found.  Blood pressure (!) 103/55, pulse 83, temperature 99.1 F (37.3 C), resp. rate 15, SpO2 99 %.  Assessment/Plan Will admit patient for overnight observation and postoperative pain management. Anticipate discharge tomorrow. Will ask internal medicine to evaluate to assist with medical management has patient is on insulin pump. We'll have physical therapy evaluate and assist with nonweightbearing status. Plan to discharge tomorrow.  [name] A 02/02/2017, 5:56 PM

## 2017-02-02 NOTE — Consult Note (Signed)
[employer] - [place] [facility]   PATIENT NAME: Jeffrey Medina    MR#:  [id number]  DATE OF BIRTH:  1961-01-26  DATE OF ADMISSION:  02/02/2017  PRIMARY CARE PHYSICIAN: [facility]   REQUESTING/REFERRING PHYSICIAN: Ether GriffinsFowler  CHIEF COMPLAINT:  Post op- DM and Htn management.  HISTORY OF PRESENT ILLNESS: Jeffrey SignsGregory Medina  is a 56 y.o. male with a known history of Charcot joints, renal insufficiency, diabetes, hypertension, hypothyroidism- was taken for reconstructive surgery on his foot today by podiatry, and had extended sedation during the procedure. Because of that podiatry Dr. decided to keep him in the hospital for observation for 1 night. Medical consult is called in to help manage his medical issues like diabetes which is managing with insulin pump, hypertension, hypothyroidism, chronic kidney disease. Patient is seen postop in room, he denies any complaints.  PAST MEDICAL HISTORY:   Past Medical History:  Diagnosis Date  . Cellulitis    left ankle  . Chronic kidney disease    Renal Insufficiency  . Diabetes mellitus without complication (HCC)   . Heart murmur   . Hypertension   . Hyperthyroidism    in the past  . Hypothyroidism   . Insulin pump in place    Omnipod Insulin Pump  . Neuropathy in diabetes [facility](HCC)     PAST SURGICAL HISTORY: Past Surgical History:  Procedure Laterality Date  . ANKLE RECONSTRUCTION Right 1987  . BACK SURGERY    . CARDIAC CATHETERIZATION    . NECK SURGERY     Cervical Fusion  . SHOULDER SURGERY Bilateral     SOCIAL HISTORY:  Social History  Substance Use Topics  . Smoking status: Never Smoker  . Smokeless tobacco: Current User    Types: Snuff  . Alcohol use No    FAMILY HISTORY:  Family History  Problem Relation Age of Onset  . Diabetes Father     DRUG ALLERGIES:  Allergies  Allergen Reactions  . Hctz [Hydrochlorothiazide] Other (See Comments)    "Adverse Reaction to HCTZ--Sodium Level dropped  to critical low"  . Tylenol [Acetaminophen] Other (See Comments)    "affect blood sugar readings."    REVIEW OF SYSTEMS:   CONSTITUTIONAL: No fever, fatigue or weakness.  EYES: No blurred or double vision.  EARS, NOSE, AND THROAT: No tinnitus or ear pain.  RESPIRATORY: No cough, shortness of breath, wheezing or hemoptysis.  CARDIOVASCULAR: No chest pain, orthopnea, edema.  GASTROINTESTINAL: No nausea, vomiting, diarrhea or abdominal pain.  GENITOURINARY: No dysuria, hematuria.  ENDOCRINE: No polyuria, nocturia,  HEMATOLOGY: No anemia, easy bruising or bleeding SKIN: No rash or lesion. MUSCULOSKELETAL: No joint pain or arthritis.   NEUROLOGIC: No tingling, numbness, weakness.  PSYCHIATRY: No anxiety or depression.   MEDICATIONS AT HOME:  Prior to Admission medications   Medication Sig Start Date End Date Taking? Authorizing Provider  albuterol (PROVENTIL) (2.5 MG/3ML) 0.083% nebulizer solution Take 3 mLs (2.5 mg total) by nebulization every 4 (four) hours as needed for wheezing or shortness of breath. 07/08/15  Yes Sharman CheekStafford, Phillip, MD  amLODipine (NORVASC) 10 MG tablet Take 5 mg by mouth daily.    Yes [provider]  aspirin EC 81 MG tablet Take 81 mg by mouth daily.    Yes [provider]  atorvastatin (LIPITOR) 80 MG tablet Take 40 mg by mouth daily at 6 PM.    Yes [provider]  cetirizine (ZYRTEC) 10 MG tablet Take 10 mg by mouth daily as needed  for allergies.    Yes [provider]  finasteride (PROSCAR) 5 MG tablet Take 5 mg by mouth daily.   Yes [provider]  gabapentin (NEURONTIN) 300 MG capsule Take 600 mg by mouth 2 (two) times daily.    Yes [provider]  insulin aspart (NOVOLOG) 100 UNIT/ML injection Uses in OMNIPOD Insulin Pump   Yes [provider]  Insulin Disposable Pump (OMNIPOD) MISC USE AS DIRECTED, CHANGING EVERY 2 TO 3 DAYS 01/30/14  Yes [provider]  levothyroxine (SYNTHROID,  LEVOTHROID) 175 MCG tablet Take 175 mcg by mouth daily before breakfast.   Yes [provider]  Omega-3 Fatty Acids (FISH OIL) 1200 MG CAPS Take 1 capsule by mouth daily.   Yes [provider]  oxyCODONE (OXY IR/ROXICODONE) 5 MG immediate release tablet Take 5 mg by mouth every 4 (four) hours as needed for severe pain.   Yes [provider]  tamsulosin (FLOMAX) 0.4 MG CAPS capsule Take 0.8 mg by mouth at bedtime.    Yes [provider]  sildenafil (VIAGRA) 100 MG tablet Take 100 mg by mouth daily as needed for erectile dysfunction.    [provider]      PHYSICAL EXAMINATION:   VITAL SIGNS: Blood pressure (!) 109/57, pulse 86, temperature 97.3 F (36.3 C), temperature source Axillary, resp. rate 16, SpO2 98 %.  GENERAL:  56 y.o.-year-old patient lying in the bed with no acute distress.  EYES: Pupils equal, round, reactive to light and accommodation. No scleral icterus. Extraocular muscles intact.  HEENT: Head atraumatic, normocephalic. Oropharynx and nasopharynx clear.  NECK:  Supple, no jugular venous distention. No thyroid enlargement, no tenderness.  LUNGS: Normal breath sounds bilaterally, no wheezing, rales,rhonchi or crepitation. No use of accessory muscles of respiration.  CARDIOVASCULAR: S1, S2 normal. No murmurs, rubs, or gallops.  ABDOMEN: Soft, nontender, nondistended. Bowel sounds present. No organomegaly or mass.  Diabetes pump sensor present on abdominal wall and on his arm. EXTREMITIES: No pedal edema, cyanosis, or clubbing. Left foot surgical dressing present. NEUROLOGIC: Cranial nerves II through XII are intact. Muscle strength 5/5 in all extremities. Sensation intact. Gait not checked.  PSYCHIATRIC: The patient is alert and oriented x 3.  SKIN: No obvious rash, lesion, or ulcer.   LABORATORY PANEL:   CBC  Recent Labs Lab 02/02/17 1905  WBC 7.1  HGB 12.3*  HCT 35.9*  PLT 190  MCV 87.9  MCH 30.0  MCHC 34.2  RDW 14.4    ------------------------------------------------------------------------------------------------------------------  Chemistries   Recent Labs Lab 02/02/17 1905  CREATININE 1.91*   ------------------------------------------------------------------------------------------------------------------ estimated creatinine clearance is 45.4 mL/min (A) (by C-G formula based on SCr of 1.91 mg/dL (H)). ------------------------------------------------------------------------------------------------------------------ No results for input(s): TSH, T4TOTAL, T3FREE, THYROIDAB in the last 72 hours.  Invalid input(s): FREET3   Coagulation profile No results for input(s): INR, PROTIME in the last 168 hours. ------------------------------------------------------------------------------------------------------------------- No results for input(s): DDIMER in the last 72 hours. -------------------------------------------------------------------------------------------------------------------  Cardiac Enzymes No results for input(s): CKMB, TROPONINI, MYOGLOBIN in the last 168 hours.  Invalid input(s): CK ------------------------------------------------------------------------------------------------------------------ Invalid input(s): POCBNP  ---------------------------------------------------------------------------------------------------------------  Urinalysis    Component Value Date/Time   COLORURINE YELLOW 05/03/2014 1257   APPEARANCEUR CLEAR 05/03/2014 1257   LABSPEC 1.010 05/03/2014 1257   PHURINE 5.0 05/03/2014 1257   GLUCOSEU 300 mg/dL 96/12/5407 8119   HGBUR NEGATIVE 05/03/2014 1257   BILIRUBINUR NEGATIVE 05/03/2014 1257   KETONESUR 2+ 05/03/2014 1257   PROTEINUR NEGATIVE 05/03/2014 1257   NITRITE NEGATIVE 05/03/2014 1257  LEUKOCYTESUR NEGATIVE 05/03/2014 1257     RADIOLOGY: No results found.  EKG: Orders placed or performed during the hospital encounter of 01/24/17  .  EKG 12-Lead  . EKG 12-Lead    IMPRESSION AND PLAN:  * Diabetes   Patient is managing his diabetes with insulin pump, his wife is also present in the room and both of them are very confident about how to use the pump and its effectiveness.   Currently blood sugar is slightly high as patient did not take any insulin in the morning before surgery.   I would suggest to allow patient and his wife to keep managing the blood sugar with his use of the pump.  * Hypertension   Blood pressure is stable, continue amlodipine.  * Hyperlipidemia   Continue atorvastatin.  * Hypothyroidism   Continue levothyroxine.  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   Management per podiatry.  All the records are reviewed and case discussed with ED provider. Management plans discussed with the patient, family and they are in agreement.  CODE STATUS: Full code    Code Status Orders        Start     Ordered   02/02/17 1850  Full code  Continuous     02/02/17 1849    Code Status History    Date Active Date Inactive Code Status Order ID Comments User Context   This patient has a current code status but no historical code status.     Patient's wife was present in the room during my visit.  TOTAL TIME TAKING CARE OF THIS PATIENT: 50 minutes.    Altamese Dilling M.D on 02/02/2017   Between 7am to 6pm - Pager - [phone]  After 6pm go to [email or web address] - password [email or web address]  [facility] [place] [facility]  Office  [phone]  CC: Primary care physician; [facility]   Note: This dictation was prepared with Dragon dictation along with smaller phrase technology. Any transcriptional errors that result from this process are unintentional.

## 2017-02-02 NOTE — Anesthesia Post-op Follow-up Note (Cosign Needed)
Anesthesia QCDR form completed.        

## 2017-02-02 NOTE — H&P (Signed)
Jeffrey Medina is an 56 y.o. male.   Chief Complaint:  <principal problem nDorothe Peaot specified>   HPI: Patient admitted for postoperative pain management and evaluation. Underwent major reconstructive surgery to left foot and ankle. Patient has a history of insulin-dependent diabetes on insulin pump and self-catheterization. Has a history of chronic renal insufficiency as well.  Past Medical History:  Diagnosis Date  . Cellulitis    left ankle  . Chronic kidney disease    Renal Insufficiency  . Diabetes mellitus without complication (HCC)   . Heart murmur   . Hypertension   . Hyperthyroidism    in the past  . Hypothyroidism   . Insulin pump in place    Omnipod Insulin Pump  . Neuropathy in diabetes [facility](HCC)     Past Surgical History:  Procedure Laterality Date  . ANKLE RECONSTRUCTION Right 1987  . BACK SURGERY    . CARDIAC CATHETERIZATION    . NECK SURGERY     Cervical Fusion  . SHOULDER SURGERY Bilateral     Family History  Problem Relation Age of Onset  . Diabetes Father    Social History:  reports that he has never smoked. His smokeless tobacco use includes Snuff. He reports that he does not drink alcohol or use drugs.  Allergies:  Allergies  Allergen Reactions  . Hctz [Hydrochlorothiazide] Other (See Comments)    "Adverse Reaction to HCTZ--Sodium Level dropped to critical low"  . Tylenol [Acetaminophen] Other (See Comments)    "affect blood sugar readings."    ROS  Medications Prior to Admission  Medication Sig Dispense Refill  . albuterol (PROVENTIL) (2.5 MG/3ML) 0.083% nebulizer solution Take 3 mLs (2.5 mg total) by nebulization every 4 (four) hours as needed for wheezing or shortness of breath. 75 mL 0  . amLODipine (NORVASC) 10 MG tablet Take 5 mg by mouth daily.     Marland Kitchen. aspirin EC 81 MG tablet Take 81 mg by mouth daily.     Marland Kitchen. atorvastatin (LIPITOR) 80 MG tablet Take 40 mg by mouth daily at 6 PM.     . cetirizine (ZYRTEC) 10 MG tablet Take 10 mg by mouth daily as  needed for allergies.     . finasteride (PROSCAR) 5 MG tablet Take 5 mg by mouth daily.    Marland Kitchen. gabapentin (NEURONTIN) 300 MG capsule Take 600 mg by mouth 2 (two) times daily.     . insulin aspart (NOVOLOG) 100 UNIT/ML injection Uses in OMNIPOD Insulin Pump    . Insulin Disposable Pump (OMNIPOD) MISC USE AS DIRECTED, CHANGING EVERY 2 TO 3 DAYS    . levothyroxine (SYNTHROID, LEVOTHROID) 175 MCG tablet Take 175 mcg by mouth daily before breakfast.    . Omega-3 Fatty Acids (FISH OIL) 1200 MG CAPS Take 1 capsule by mouth daily.    Marland Kitchen. oxyCODONE (OXY IR/ROXICODONE) 5 MG immediate release tablet Take 5 mg by mouth every 4 (four) hours as needed for severe pain.    . tamsulosin (FLOMAX) 0.4 MG CAPS capsule Take 0.8 mg by mouth at bedtime.     . sildenafil (VIAGRA) 100 MG tablet Take 100 mg by mouth daily as needed for erectile dysfunction.      Physical Exam: General: Alert and oriented.  No apparent distress. Vascular:  Left foot:Dorsalis Pedis:  present Posterior Tibial:  present  Right foot: Dorsalis Pedis:  present Posterior Tibial:  present Neuro:absent protective sensation Derm: No ulcerations. Dorsal and medial left foot incisions are cover with bandaging. Ortho/MS: Patient's a posterior  splint status post reconstructive surgery left foot   Results for orders placed or performed during the hospital encounter of 02/02/17 (from the past 48 hour(s))  Glucose, capillary     Status: Abnormal   Collection Time: 02/02/17 10:02 AM  Result Value Ref Range   Glucose-Capillary 247 (H) 65 - 99 mg/dL  Glucose, capillary     Status: Abnormal   Collection Time: 02/02/17 11:18 AM  Result Value Ref Range   Glucose-Capillary 234 (H) 65 - 99 mg/dL  Glucose, capillary     Status: Abnormal   Collection Time: 02/02/17 12:33 PM  Result Value Ref Range   Glucose-Capillary 273 (H) 65 - 99 mg/dL  Glucose, capillary     Status: Abnormal   Collection Time: 02/02/17  2:35 PM  Result Value Ref Range    Glucose-Capillary 360 (H) 65 - 99 mg/dL  Glucose, capillary     Status: Abnormal   Collection Time: 02/02/17  3:13 PM  Result Value Ref Range   Glucose-Capillary 335 (H) 65 - 99 mg/dL  Glucose, capillary     Status: Abnormal   Collection Time: 02/02/17  5:51 PM  Result Value Ref Range   Glucose-Capillary 224 (H) 65 - 99 mg/dL   No results found.  Blood pressure 110/63, pulse 83, temperature 99.1 F (37.3 C), resp. rate 15, SpO2 100 %.  Assessment/Plan Will admit for observation to monitor for postoperative pain management. Will ask internal medicine to evaluate for assistance with diabetic and general medical management. Anticipate discharge tomorrow if he still well postoperatively. We'll ask physical therapy to evaluate tomorrow as well.  [name] A 02/02/2017, 6:09 PM

## 2017-02-02 NOTE — Anesthesia Procedure Notes (Signed)
Anesthesia Regional Block: Popliteal block   Pre-Anesthetic Checklist: ,, timeout performed, Correct Patient, Correct Site, Correct Laterality, Correct Procedure, Correct Position, site marked, Risks and benefits discussed,  Surgical consent,  Pre-op evaluation,  At surgeon's request and post-op pain management  Laterality: Left and Lower  Prep: chloraprep       Needles:  Injection technique: Single-shot  Needle Type: Echogenic Needle     Needle Length: 9cm  Needle Gauge: 21     Additional Needles:   Procedures: ultrasound guided,,,,,,,,  Narrative:  Start time: 02/02/2017 10:58 AM End time: 02/02/2017 11:01 AM Injection made incrementally with aspirations every 5 mL.  Performed by: Personally  Anesthesiologist: Margorie JohnPISCITELLO, Ireoluwa Gorsline K  Additional Notes: Patient endorses PreOp weakness and numbness in left leg and foot 2/2 his disease  Functioning IV was confirmed and monitors were applied.  A echogenic needle was used. Sterile prep,hand hygiene and sterile gloves were used.  Mild sedation used for the procedure.  No paresthesia endorsed by patient during the procedure.  Negative aspiration and negative test dose prior to incremental administration of local anesthetic. The patient tolerated the procedure well with no immediate complications.

## 2017-02-02 NOTE — Op Note (Addendum)
Operative note   Surgeon: Lawyer: Dr. Sharlotte Alamo    Preop diagnosis: 1 left lower extremity equinus 2.   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 3. Severe degenerative arthritis navicular cuneiform joints. Consistent with Charcot foot deformity    Postop diagnosis: Same    Procedure: 1. Percutaneous tendo Achilles lengthening left lower leg 2.  ORIF tarsometatarsal joints 1-3 with Tarsometatarsal joint fusion first and second and third tarsometatarsal joints 3 navicular cuneiform joint fusion medial navicular cuneiform 4. Intercuneiform fusion medial to middle cuneiform.    EBL: 100 ML's    Anesthesia:regional and general    Hemostasis: Thigh tourniquet inflated to 250 mmHg for 126 minutes    Specimen: None    Complications: None    Operative indications:Jeffrey Medina is an 56 y.o. that presents today for surgical intervention.  The risks/benefits/alternatives/complications have been discussed and consent has been given.    Procedure:  Patient was brought into the OR and placed on the operating table in thesupine position. After anesthesia was obtained theleft lower extremity was prepped and draped in usual sterile fashion.  Attention was initially directed to the posterior aspect of the Achilles tendon were 3 stab incisions were performed. 2 lateral and one medial hemi-sections of the Achilles tendon were performed. The foot was dorsiflexed and good excursion of the Achilles was noted. The incisions were closed with a 3-0 nylon.  Attention was then directed to the dorsal aspect of the left midfoot where a longitudinal incision was made just lateral to the dorsalis pedis artery. Sharp and blunt dissection carried down through the subcutaneous tissue and deep tissues to all of the scar tissue down to the second third met cuneiform joint region. At this time subperiosteal dissection was taken undertaken and carried medial and  lateral. There was noted to be complete dislocation of the second third and fourth met cuneiform and met cuboid joint regions. A second incision was made overlying the first met cuneiform joint. Sharp and blunt dissection was carried down to the subcutaneous tissue and subperiosteal dissection was undertaken. There was noted be markedly destructive changes of the first met cuneiform and navicular cuneiform joint with the intermediate to medial cuneiform joint noted the arthritic as well. All joints were then removed of any residual articular cartilage and prepped for fusion. Reduction of the dislocation was then performed. The dorsal aspect of the midfoot is noted to have marked amount of exostosis and hypertrophied bone and this was then reduced with power saw and power rasp. After marketed removal of fibrotic and nonviable tissue I was able to finally reduce the second and third met cuneiform joints to a more anatomic position. Removal of the scar tissue between the first and second met met cuneiform joint was performed. I was able to then reduce this back into a more anatomically aligned position. Initial stabilization of all dislocated joints from the navicular cuneiform medial to middle cuneiform first met cuneiform second met cuneiform and third met cuneiforms were stabilized with 4.0 mm cannulated screws of appropriate length with good compression. Good alignment was then noted. At this time a dorsal locking plate from the [employer] 28 screw set was placed from the the navicular, spanning the cuneiform to the first metatarsal. Good alignment was noted with good stability after 3.5 mm screw placement. A second plate was then placed overlying the third met cuneiform joint with 2 screws proximal and 2 screws distally placed. A third plate was  placed from the second met cuneiform with 2 screws proximal and 2 screws distal with good stability noted. Final fluoroscopy revealed marked realignment of the foot. At this  time the wounds were flushed with copious amounts or irrigation. Prior to final fusion all joints were prepped and packed with Trinity Elite bone graft. After final irrigation and closure was performed with a combination of 30 and 2-0 Vicryl and 3-0 nylon for the skin. 0.25% Marcaine was placed around all areas. He is placed in a well compressive sterile bulky dressing and placed in a posterior splint at 90.     Patient tolerated the procedure and anesthesia well.  Was transported from the OR to the PACU with all vital signs stable and vascular status intact. To be admitted overnight for observation and postoperative pain management.  Will follow up in approximately 1 week in the outpatient clinic.

## 2017-02-02 NOTE — Anesthesia Postprocedure Evaluation (Signed)
Anesthesia Post Note  Patient: Jeffrey Medina  Procedure(Medina) Performed: Procedure(Medina) (LRB): ACHILLES LENGTHENING/TENO (Left) ARTHRODESIS FOOT-LISFRANC; MULTIPLE (Left) METATARSAL CUNEIFORM JOINTS (Left)  Patient location during evaluation: PACU Anesthesia Type: General Level of consciousness: awake and alert Pain management: pain level controlled Vital Signs Assessment: post-procedure vital signs reviewed and stable Respiratory status: spontaneous breathing, nonlabored ventilation, respiratory function stable and patient connected to nasal cannula oxygen Cardiovascular status: blood pressure returned to baseline and stable Postop Assessment: no signs of nausea or vomiting Anesthetic complications: no     Last Vitals:  Vitals:   02/02/17 1846 02/02/17 1919  BP: 121/63 112/61  Pulse: 83 77  Resp:  16  Temp: 36.6 C 36.5 C    Last Pain:  Vitals:   02/02/17 1919  TempSrc: Oral  PainSc:                  Jeffrey Medina

## 2017-02-02 NOTE — Anesthesia Procedure Notes (Signed)
Procedure Name: Intubation Performed by: Raahil Ong Pre-anesthesia Checklist: Patient identified, Patient being monitored, Timeout performed, Emergency Drugs available and Suction available Patient Re-evaluated:Patient Re-evaluated prior to inductionOxygen Delivery Method: Circle system utilized Preoxygenation: Pre-oxygenation with 100% oxygen Intubation Type: IV induction Ventilation: Mask ventilation without difficulty Laryngoscope Size: Traber and 2 Grade View: Grade I Tube type: Oral Tube size: 7.5 mm Number of attempts: 1 Airway Equipment and Method: Stylet Placement Confirmation: ETT inserted through vocal cords under direct vision,  positive ETCO2 and breath sounds checked- equal and bilateral Secured at: 22 cm Tube secured with: Tape Dental Injury: Teeth and Oropharynx as per pre-operative assessment      

## 2017-02-02 NOTE — H&P (Signed)
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  The various methods of treatment have been discussed with the patient.  No guarantees were given.  After consideration of risks, benefits and other options for treatment, the patient has consented to surgery.  I have reviewed the patients' chart and labs.    No data found.   A history and physical examination was performed in my office.  The patient was reexamined.  There have been no changes to this history and physical examination.  Gwyneth RevelsFowler, Audray Rumore A

## 2017-02-02 NOTE — Transfer of Care (Signed)
Immediate Anesthesia Transfer of Care Note  Patient: Jeffrey Medina  Procedure(s) Performed: Procedure(s): ACHILLES LENGTHENING/TENO (Left) ARTHRODESIS FOOT-LISFRANC; MULTIPLE (Left) METATARSAL CUNEIFORM JOINTS (Left)  Patient Location: PACU  Anesthesia Type:General  Level of Consciousness: sedated  Airway & Oxygen Therapy: Patient Spontanous Breathing and Patient connected to face mask oxygen  Post-op Assessment: Report given to RN and Post -op Vital signs reviewed and stable  Post vital signs: Reviewed and stable  Last Vitals:  Vitals:   02/02/17 1115 02/02/17 1120  BP:    Pulse: 65 64  Resp: (!) 22 14  Temp:      Last Pain:  Vitals:   02/02/17 1009  TempSrc: Oral  PainSc: 0-No pain         Complications: No apparent anesthesia complications

## 2017-02-02 NOTE — Anesthesia Preprocedure Evaluation (Signed)
Anesthesia Evaluation  Patient identified by MRN, date of birth, ID band Patient awake    Reviewed: Allergy & Precautions, H&P , NPO status , Patient's Chart, lab work & pertinent test results  History of Anesthesia Complications Negative for: history of anesthetic complications  Airway Mallampati: II  TM Distance: >3 FB Neck ROM: limited    Dental  (+) Poor Dentition, Chipped, Missing   Pulmonary neg pulmonary ROS, neg shortness of breath,    Pulmonary exam normal breath sounds clear to auscultation       Cardiovascular Exercise Tolerance: Good hypertension, (-) angina(-) Past MI and (-) DOE Normal cardiovascular exam Rhythm:regular Rate:Normal     Neuro/Psych negative neurological ROS  negative psych ROS   GI/Hepatic negative GI ROS, Neg liver ROS,   Endo/Other  diabetes, Type 2Hypothyroidism Hyperthyroidism   Renal/GU Renal disease     Musculoskeletal   Abdominal   Peds  Hematology negative hematology ROS (+)   Anesthesia Other Findings Patient has insulin pump running at basal rate  Past Medical History: No date: Cellulitis     Comment: left ankle No date: Chronic kidney disease     Comment: Renal Insufficiency No date: Diabetes mellitus without complication (HCC) No date: Heart murmur No date: Hypertension No date: Hyperthyroidism     Comment: in the past No date: Hypothyroidism No date: Insulin pump in place     Comment: Omnipod Insulin Pump No date: Neuropathy in diabetes [facility](HCC)  Past Surgical History: 1987: ANKLE RECONSTRUCTION Right No date: BACK SURGERY No date: CARDIAC CATHETERIZATION No date: NECK SURGERY     Comment: Cervical Fusion No date: SHOULDER SURGERY Bilateral     Reproductive/Obstetrics negative OB ROS                             Anesthesia Physical Anesthesia Plan  ASA: III  Anesthesia Plan: General ETT   Post-op Pain Management:     Induction: Intravenous  Airway Management Planned: Oral ETT  Additional Equipment:   Intra-op Plan:   Post-operative Plan: Extubation in OR  Informed Consent: I have reviewed the patients History and Physical, chart, labs and discussed the procedure including the risks, benefits and alternatives for the proposed anesthesia with the patient or authorized representative who has indicated his/her understanding and acceptance.   Dental Advisory Given  Plan Discussed with: Anesthesiologist, CRNA and Surgeon  Anesthesia Plan Comments: (Patient informed that they are higher risk for complications from anesthesia during this procedure due to their medical history.  Patient voiced understanding.  Patient consented for risk and benefits of nerve block including but not limited to nerve damage, bleeding and infection.  Patient voiced understanding.  Patient consented for risks of anesthesia including but not limited to:  - adverse reactions to medications - damage to teeth, lips or other oral mucosa - sore throat or hoarseness - Damage to heart, brain, lungs or loss of life  Patient voiced understanding.)        Anesthesia Quick Evaluation

## 2017-02-03 DIAGNOSIS — E11610 Type 2 diabetes mellitus with diabetic neuropathic arthropathy: Secondary | ICD-10-CM | POA: Diagnosis not present

## 2017-02-03 LAB — GLUCOSE, CAPILLARY: Glucose-Capillary: 283 mg/dL — ABNORMAL HIGH (ref 65–99)

## 2017-02-03 LAB — BASIC METABOLIC PANEL
Anion gap: 5 (ref 5–15)
BUN: 23 mg/dL — ABNORMAL HIGH (ref 6–20)
CHLORIDE: 99 mmol/L — AB (ref 101–111)
CO2: 27 mmol/L (ref 22–32)
CREATININE: 1.66 mg/dL — AB (ref 0.61–1.24)
Calcium: 8.1 mg/dL — ABNORMAL LOW (ref 8.9–10.3)
GFR calc non Af Amer: 46 mL/min — ABNORMAL LOW (ref >60)
GFR, EST AFRICAN AMERICAN: 53 mL/min — AB (ref >60)
Glucose, Bld: 331 mg/dL — ABNORMAL HIGH (ref 65–99)
POTASSIUM: 4.5 mmol/L (ref 3.5–5.1)
Sodium: 131 mmol/L — ABNORMAL LOW (ref 135–145)

## 2017-02-03 NOTE — Progress Notes (Signed)
[employer] - [place] [facility]   PATIENT NAME: Jeffrey Medina    MR#:  [id number]  DATE OF BIRTH:  January 06, 1961  SUBJECTIVE:  CHIEF COMPLAINT:  No chief complaint on file.  No complaint. REVIEW OF SYSTEMS:  Review of Systems  Constitutional: Negative for chills.  HENT: Negative for congestion.   Eyes: Negative for blurred vision and double vision.  Respiratory: Negative for cough, shortness of breath, wheezing and stridor.   Cardiovascular: Negative for chest pain and leg swelling.  Gastrointestinal: Negative for abdominal pain, diarrhea, nausea and vomiting.  Musculoskeletal: Negative for back pain.  Neurological: Negative for dizziness, focal weakness and loss of consciousness.  Psychiatric/Behavioral: Negative for depression. The patient is not nervous/anxious.     DRUG ALLERGIES:   Allergies  Allergen Reactions  . Hctz [Hydrochlorothiazide] Other (See Comments)    "Adverse Reaction to HCTZ--Sodium Level dropped to critical low"  . Tylenol [Acetaminophen] Other (See Comments)    "affect blood sugar readings."   VITALS:  Blood pressure 134/68, pulse 79, temperature 98.2 F (36.8 C), temperature source Oral, resp. rate 18, SpO2 97 %. PHYSICAL EXAMINATION:  Physical Exam  Constitutional: He is oriented to person, place, and time and well-developed, well-nourished, and in no distress.  HENT:  Head: Normocephalic.  Eyes: Conjunctivae and EOM are normal.  Neck: Normal range of motion. Neck supple.  Cardiovascular: Normal rate, regular rhythm and normal heart sounds.  Exam reveals no gallop.   No murmur heard. Pulmonary/Chest: Effort normal and breath sounds normal. No respiratory distress. He has no wheezes. He has no rales.  Abdominal: Soft. He exhibits no distension. There is no tenderness.  Musculoskeletal: Normal range of motion. He exhibits no edema.  Left foot in dressing.  Neurological: He is alert and oriented to person, place, and time.  No cranial nerve deficit.  Skin: No rash noted. No erythema.  Psychiatric: Affect and judgment normal.   LABORATORY PANEL:  Male CBC  Recent Labs Lab 02/02/17 1905  WBC 7.1  HGB 12.3*  HCT 35.9*  PLT 190   ------------------------------------------------------------------------------------------------------------------ Chemistries   Recent Labs Lab 02/03/17 0416  NA 131*  K 4.5  CL 99*  CO2 27  GLUCOSE 331*  BUN 23*  CREATININE 1.66*  CALCIUM 8.1*   RADIOLOGY:  No results found. ASSESSMENT AND PLAN:   * Diabetes   Patient is managing his diabetes with insulin pump, BS is elevated but stable. BS 283.  * Hypertension   Blood pressure is stable, continue amlodipine.  * Hyperlipidemia   Continue atorvastatin.  * Hypothyroidism   Continue levothyroxine.  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   Management per podiatry.  Hyponatremia. Due to elevate BS. f/u PCP.  Medically stable to be discharged to home.  All the records are reviewed and case discussed with Care Management/Social Worker. Management plans discussed with the patient, family and they are in agreement.  CODE STATUS: Full Code  TOTAL TIME TAKING CARE OF THIS PATIENT: 20 minutes.   More than 50% of the time was spent in counseling/coordination of care: YES  POSSIBLE D/C today, DEPENDING ON CLINICAL CONDITION.   Shaune Pollackhen, Lelan Cush M.D on 02/03/2017 at 10:10 AM  Between 7am to 6pm - Pager - [phone]  After 6pm go to [email or web address] - Social research officer, governmentpassword [email or web address]  [facility] [place] [facility]  Office  [phone]  CC: Primary care physician; [facility]  Note: This dictation was prepared with Dragon dictation along with smaller phrase technology.  Any transcriptional errors that result from this process are unintentional.

## 2017-02-03 NOTE — Discharge Summary (Signed)
Physician Discharge Summary  Patient ID: Jeffrey Medina MRN: [id number] DOB/AGE: May 08, 1961 56 y.o.  Admit date: 02/02/2017 Discharge date: 02/03/2017  Admission Diagnoses:  <principal problem not specified>  Discharge Diagnoses:  Active Problems:   Post-op pain   Past Medical History:  Diagnosis Date  . Cellulitis    left ankle  . Chronic kidney disease    Renal Insufficiency  . Diabetes mellitus without complication (HCC)   . Heart murmur   . Hypertension   . Hyperthyroidism    in the past  . Hypothyroidism   . Insulin pump in place    Omnipod Insulin Pump  . Neuropathy in diabetes [facility](HCC)     Surgeries: Procedure(s): ACHILLES LENGTHENING/TENO ARTHRODESIS FOOT-LISFRANC; MULTIPLE METATARSAL CUNEIFORM JOINTS on 02/02/2017   Consultants (if any): Treatment Team:  Altamese DillingVachhani, Vaibhavkumar, MD  Discharged Condition: Improved  Hospital Course: Jeffrey Medina is an 56 y.o. male who was admitted 02/02/2017 with a diagnosis of charco foot reconstruction and post op painand went to the operating room on 02/02/2017 and underwent the above named procedures.    He was given perioperative antibiotics:  Anti-infectives    Start     Dose/Rate Route Frequency Ordered Stop   02/02/17 0954  ceFAZolin (ANCEF) 2-4 GM/100ML-% IVPB    Comments:  Leilani AbleRobinson, Pat: cabinet override      02/02/17 0954 02/02/17 1123   02/02/17 0600  ceFAZolin (ANCEF) IVPB 2g/100 mL premix     2 g 200 mL/hr over 30 Minutes Intravenous On call to O.R. 02/01/17 2209 02/02/17 1153    .  He was given sequential compression devices, early ambulation, and lovenox for DVT prophylaxis.  Will take ASA 81mg  at home.  He benefited maximally from the hospital stay and there were no complications. Afebrile throughtout stay.  Pain well controlled with po pain meds.   Recent vital signs:  Vitals:   02/03/17 0443 02/03/17 0739  BP: 133/65 134/68  Pulse: 77 79  Resp: 16 18  Temp: 98.2 F (36.8 C)     Recent  laboratory studies:  Lab Results  Component Value Date   HGB 12.3 (L) 02/02/2017   HGB 13.1 01/24/2017   HGB 14.0 07/08/2015   Lab Results  Component Value Date   WBC 7.1 02/02/2017   PLT 190 02/02/2017   No results found for: INR Lab Results  Component Value Date   NA 131 (L) 02/03/2017   K 4.5 02/03/2017   CL 99 (L) 02/03/2017   CO2 27 02/03/2017   BUN 23 (H) 02/03/2017   CREATININE 1.66 (H) 02/03/2017   GLUCOSE 331 (H) 02/03/2017    Discharge Medications:   Allergies as of 02/03/2017      Reactions   Hctz [hydrochlorothiazide] Other (See Comments)   "Adverse Reaction to HCTZ--Sodium Level dropped to critical low"   Tylenol [acetaminophen] Other (See Comments)   "affect blood sugar readings."      Medication List    TAKE these medications   albuterol (2.5 MG/3ML) 0.083% nebulizer solution Commonly known as:  PROVENTIL Take 3 mLs (2.5 mg total) by nebulization every 4 (four) hours as needed for wheezing or shortness of breath.   amLODipine 10 MG tablet Commonly known as:  NORVASC Take 5 mg by mouth daily.   aspirin EC 81 MG tablet Take 81 mg by mouth daily.   atorvastatin 80 MG tablet Commonly known as:  LIPITOR Take 40 mg by mouth daily at 6 PM.   cetirizine 10 MG tablet Commonly known  as:  ZYRTEC Take 10 mg by mouth daily as needed for allergies.   finasteride 5 MG tablet Commonly known as:  PROSCAR Take 5 mg by mouth daily.   Fish Oil 1200 MG Caps Take 1 capsule by mouth daily.   gabapentin 300 MG capsule Commonly known as:  NEURONTIN Take 600 mg by mouth 2 (two) times daily.   insulin aspart 100 UNIT/ML injection Commonly known as:  novoLOG Uses in OMNIPOD Insulin Pump   levothyroxine 175 MCG tablet Commonly known as:  SYNTHROID, LEVOTHROID Take 175 mcg by mouth daily before breakfast.   OMNIPOD Misc USE AS DIRECTED, CHANGING EVERY 2 TO 3 DAYS   oxyCODONE 5 MG immediate release tablet Commonly known as:  Oxy IR/ROXICODONE Take 5 mg by  mouth every 4 (four) hours as needed for severe pain.   sildenafil 100 MG tablet Commonly known as:  VIAGRA Take 100 mg by mouth daily as needed for erectile dysfunction.   tamsulosin 0.4 MG Caps capsule Commonly known as:  FLOMAX Take 0.8 mg by mouth at bedtime.       Diagnostic Studies: No results found.  Disposition: 01-Home or Self Care  Discharge Instructions    Call MD / Call 911    Complete by:  As directed    If you experience chest pain or shortness of breath, CALL 911 and be transported to the hospital emergency room.  If you develope a fever above 101 F, pus (white drainage) or increased drainage or redness at the wound, or calf pain, call your surgeon's office.   Constipation Prevention    Complete by:  As directed    Drink plenty of fluids.  Prune juice may be helpful.  You may use a stool softener, such as Colace (over the counter) 100 mg twice a day.  Use MiraLax (over the counter) for constipation as needed.     Pt seen by PT and cleared. Pt has RX for pain meds at home.      Signed: [name] A 02/03/2017, 10:37 AM

## 2017-02-03 NOTE — Progress Notes (Signed)
Pt  Discharge home with orders from Dr. Ether GriffinsFowler. Belonging packed by family, explained discharged information, Pt left in stable condition. Wheeled out by nursing I wheel chair.

## 2017-02-03 NOTE — Evaluation (Signed)
Physical Therapy Evaluation Patient Details Name: Jeffrey Medina MRN: [id number] DOB: [birth year] Today's Date: 02/03/2017   History of Present Illness  Patient is a pleasant 56 y/o male that presents for major L foot reconstruction. He is currently NWBing through lower LLE.   Clinical Impression  Patient is a pleasant 56 y/o male with major L foot reconstruction and Achilles' lengthening procedure performed. He will be NWBing through his LLE for at least a few months per patient. He has been limited with his ability to ambulate prior to surgery due to pain, and thus has several pieces of DME at home (electric scooter, knee scooter). He has been fairly independent with these, he does have mild trunk strength deficits with difficulty transitioning from supine to sit initially. He is able to appropriately navigate knee scooter, though due to his lack of sensation in LLE currently requires guarding to ensure safety. He would benefit from RW and HHPT evaluation for bathroom configuration and gait training with RW.     Follow Up Recommendations Home health PT    Equipment Recommendations  Rolling walker with 5" wheels    Recommendations for Other Services       Precautions / Restrictions Precautions Precautions: Fall Restrictions Weight Bearing Restrictions: Yes LLE Weight Bearing: Non weight bearing      Mobility  Bed Mobility Overal bed mobility: Needs Assistance Bed Mobility: Supine to Sit;Sit to Supine     Supine to sit: Min assist Sit to supine: Min assist   General bed mobility comments: Patient is able to perform hooking method to bring LLE over edge of bed. Requires very minimal assistance for trunk control.   Transfers Overall transfer level: Needs assistance Equipment used: 1 person hand held assist (Knee scooter ) Transfers: Sit to/from Stand Sit to Stand: Min guard         General transfer comment: Patient requires assistance with positioning/set up for  scooter, but he is able to manage without loss of balance.   Ambulation/Gait Ambulation/Gait assistance: Min guard Ambulation Distance (Feet): 200 Feet Assistive device:  (Knee scooter) Gait Pattern/deviations: WFL(Within Functional Limits)   Gait velocity interpretation: at or above normal speed for age/gender General Gait Details: Patient requires min guarding while using knee scooter as he is unable to feel his LLE on the scooter. He is able to brake, turn scooter appropriately.   Stairs            Wheelchair Mobility    Modified Rankin (Stroke Patients Only)       Balance Overall balance assessment: Needs assistance Sitting-balance support: No upper extremity supported Sitting balance-Leahy Scale: Good     Standing balance support: Bilateral upper extremity supported Standing balance-Leahy Scale: Fair                               Pertinent Vitals/Pain Pain Assessment: No/denies pain (Reports numbness from L thigh distally )    Home Living Family/patient expects to be discharged to:: Private residence Living Arrangements: Spouse/significant other Available Help at Discharge: Family;Available 24 hours/day Type of Home: House Home Access: Ramped entrance     Home Layout: One level Home Equipment: Walker - 4 wheels;Electric scooter (Has knee scooter at home. )      Prior Function Level of Independence: Independent with assistive device(s)         Comments: Patient was a very limited ambulator with AD (4UJ), otherwise he was using knee scooter or  electric scooter for any prolonged distances prior to this surgery.      Hand Dominance        Extremity/Trunk Assessment   Upper Extremity Assessment Upper Extremity Assessment: Overall WFL for tasks assessed    Lower Extremity Assessment Lower Extremity Assessment: LLE deficits/detail LLE Deficits / Details: Absent sensation from L thigh distally. Unable to perform SLR without assistance.   LLE Sensation: decreased light touch       Communication   Communication: No difficulties  Cognition Arousal/Alertness: Awake/alert Behavior During Therapy: WFL for tasks assessed/performed Overall Cognitive Status: Within Functional Limits for tasks assessed                                        General Comments      Exercises     Assessment/Plan    PT Assessment Patient needs continued PT services  PT Problem List Decreased strength;Decreased mobility;Decreased safety awareness;Pain;Decreased activity tolerance;Decreased balance;Decreased knowledge of use of DME;Impaired sensation       PT Treatment Interventions DME instruction;Therapeutic activities;Therapeutic exercise;Gait training;Balance training;Functional mobility training;Neuromuscular re-education;Patient/family education    PT Goals (Current goals can be found in the Care Plan section)  Acute Rehab PT Goals Patient Stated Goal: To return home PT Goal Formulation: With patient Time For Goal Achievement: 02/17/17 Potential to Achieve Goals: Good Additional Goals Additional Goal #1: Patient will transfer to and from knee scooter independently to return to home mobility.     Frequency 7X/week   Barriers to discharge        Co-evaluation               AM-PAC PT "6 Clicks" Daily Activity  Outcome Measure Difficulty turning over in bed (including adjusting bedclothes, sheets and blankets)?: A Little Difficulty moving from lying on back to sitting on the side of the bed? : A Little Difficulty sitting down on and standing up from a chair with arms (e.g., wheelchair, bedside commode, etc,.)?: None Help needed moving to and from a bed to chair (including a wheelchair)?: A Little Help needed walking in hospital room?: None Help needed climbing 3-5 steps with a railing? : Total 6 Click Score: 18    End of Session Equipment Utilized During Treatment: Gait belt Activity Tolerance: Patient  tolerated treatment well Patient left: in bed;with bed alarm set;with call bell/phone within reach Nurse Communication: Mobility status PT Visit Diagnosis: Difficulty in walking, not elsewhere classified (R26.2)    Time: [id number] PT Time Calculation (min) (ACUTE ONLY): 18 min   Charges:   PT Evaluation $PT Eval Moderate Complexity: 1 Procedure     PT G Codes:   PT G-Codes **NOT FOR INPATIENT CLASS** Functional Assessment Tool Used: AM-PAC 6 Clicks Basic Mobility Functional Limitation: Mobility: Walking and moving around Mobility: Walking and Moving Around Current Status ([id number](G8978): At least 40 percent but less than 60 percent impaired, limited or restricted Mobility: Walking and Moving Around Goal Status [phone](G8979): At least 20 percent but less than 40 percent impaired, limited or restricted   Alva GarnetPatrick Medina PT, DPT, CSCS     02/03/2017, 11:12 AM

## 2017-02-03 NOTE — Discharge Instructions (Signed)
[facility] °[facility] ° °POST OPERATIVE INSTRUCTIONS FOR DR. TROXLER AND DR. Kam Kushnir °[facility] ° ° °1. Take your medication as prescribed.  Pain medication should be taken only as needed. ° °2. Keep the dressing clean, dry and intact. ° °3. Keep your foot elevated above the heart level for the first 48 hours. ° °4. Walking to the bathroom and brief periods of walking are acceptable, unless we have instructed you to be non-weight bearing. ° °5. Always wear your post-op shoe when walking.  Always use your crutches if you are to be non-weight bearing. ° °6. Do not take a shower. Baths are permissible as long as the foot is kept out of the water.  ° °7. Every hour you are awake:  °- Bend your knee 15 times. ° °8. Call [facility] ([phone]) if any of the following problems occur: °- You develop a temperature or fever. °- The bandage becomes saturated with blood. °- Medication does not stop your pain. °- Injury of the foot occurs. °- Any symptoms of infection including redness, odor, or red streaks running from wound. ° °

## 2017-02-05 ENCOUNTER — Encounter: Payer: Self-pay | Admitting: Podiatry

## 2017-02-13 ENCOUNTER — Encounter: Payer: Self-pay | Admitting: Podiatry

## 2018-01-14 DIAGNOSIS — M5030 Other cervical disc degeneration, unspecified cervical region: Secondary | ICD-10-CM | POA: Insufficient documentation

## 2018-01-14 DIAGNOSIS — M19039 Primary osteoarthritis, unspecified wrist: Secondary | ICD-10-CM | POA: Insufficient documentation

## 2018-01-14 DIAGNOSIS — M542 Cervicalgia: Secondary | ICD-10-CM | POA: Insufficient documentation

## 2018-01-14 DIAGNOSIS — M4802 Spinal stenosis, cervical region: Secondary | ICD-10-CM | POA: Insufficient documentation

## 2018-01-14 DIAGNOSIS — Z981 Arthrodesis status: Secondary | ICD-10-CM | POA: Insufficient documentation

## 2018-01-14 DIAGNOSIS — M5412 Radiculopathy, cervical region: Secondary | ICD-10-CM | POA: Insufficient documentation

## 2018-01-15 ENCOUNTER — Ambulatory Visit (INDEPENDENT_AMBULATORY_CARE_PROVIDER_SITE_OTHER): Payer: Non-veteran care | Admitting: Podiatry

## 2018-01-15 ENCOUNTER — Other Ambulatory Visit: Payer: Self-pay | Admitting: Podiatry

## 2018-01-15 ENCOUNTER — Encounter: Payer: Self-pay | Admitting: Podiatry

## 2018-01-15 ENCOUNTER — Ambulatory Visit (INDEPENDENT_AMBULATORY_CARE_PROVIDER_SITE_OTHER): Payer: No Typology Code available for payment source

## 2018-01-15 DIAGNOSIS — R52 Pain, unspecified: Secondary | ICD-10-CM | POA: Diagnosis not present

## 2018-01-15 DIAGNOSIS — M659 Synovitis and tenosynovitis, unspecified: Secondary | ICD-10-CM

## 2018-01-15 DIAGNOSIS — M779 Enthesopathy, unspecified: Secondary | ICD-10-CM

## 2018-01-15 DIAGNOSIS — M7671 Peroneal tendinitis, right leg: Secondary | ICD-10-CM

## 2018-01-15 DIAGNOSIS — M7751 Other enthesopathy of right foot: Secondary | ICD-10-CM

## 2018-01-17 NOTE — Progress Notes (Signed)
   Subjective:  57 year old male with PMHx of T1DM presenting today as a new patient with a chief complaint of lateral right ankle pain that has been ongoing for the past couple of years. He states he had reconstructive surgery in 1987. He also had charcot foot surgery last year. He has not done anything for treatment of the pain. There are no modifying factors noted. Patient is here for further evaluation and treatment.   Past Medical History:  Diagnosis Date  . Cellulitis    left ankle  . Chronic kidney disease    Renal Insufficiency  . Diabetes mellitus without complication (HCC)   . Heart murmur   . Hypertension   . Hyperthyroidism    in the past  . Hypothyroidism   . Insulin pump in place    Omnipod Insulin Pump  . Neuropathy in diabetes (HCC)       Objective / Physical Exam:  General:  The patient is alert and oriented x3 in no acute distress. Dermatology:  Skin is warm, dry and supple bilateral lower extremities. Negative for open lesions or macerations. Vascular:  Palpable pedal pulses bilaterally. No edema or erythema noted. Capillary refill within normal limits. Neurological:  Epicritic and protective threshold grossly intact bilaterally.  Musculoskeletal Exam:  Pain on palpation to the anterior lateral medial aspects of the patient's right ankle. Mild edema noted. Pain with palpation to the peroneal tendon of the right foot. Range of motion within normal limits to all pedal and ankle joints bilateral. Muscle strength 5/5 in all groups bilateral.   Radiographic Exam:  Normal osseous mineralization. Joint spaces preserved. No fracture/dislocation/boney destruction.    Assessment: 1. Right ankle synovitis  2. Peroneal tendinitis right 3. H/o bilateral reconstructive surgery   Plan of Care:  1. Patient was evaluated. X-Rays reviewed.  2. injection of 0.5 mL Celestone Soluspan injected in the patient's right ankle. 3. Injection of 0.5 mLs Celestone Soluspan  injected into the peroneal tendon sheath of the right foot.  4. Continue taking Percocet as directed from the [facility] management [facility].  5. Order for MRI for right foot and ankle to take to the [facility].  6. patient is to return to clinic in 4 weeks to review MRI results.   Felecia ShellingBrent M. Tiann Saha, DPM [facility]  Dr. Felecia ShellingBrent M. Dalasia Predmore, DPM    [address]                                        [place], [place] [address]                Office [phone]  Fax [phone]

## 2018-01-21 ENCOUNTER — Telehealth: Payer: Self-pay | Admitting: Podiatry

## 2018-01-21 NOTE — Telephone Encounter (Signed)
[name] and stated if we needed a MRI for Jeffrey Medina that [facility] need to send a referral back to the [place] to get it Authorized

## 2018-01-31 NOTE — Telephone Encounter (Signed)
I spoke with patient and informed him that we are still waiting on the [place] to approve his MRI.  I told him as soon as I know that its approved, I will call him.  He verbally understood

## 2018-02-08 ENCOUNTER — Telehealth: Payer: Self-pay | Admitting: Podiatry

## 2018-02-08 NOTE — Telephone Encounter (Signed)
I returned patient call and had to leave message that per Steward ([place]), he will have to contact the [place] to see what is going on with the authorization.  Per [employer] with Aggie Cosier, a second request for authorization was sent to [facility] for [facility], but [facility] did not respond in time.  She stated that the patient will have to contact them to find out what's going on

## 2018-02-08 NOTE — Telephone Encounter (Signed)
[facility] and was [facility] to check on [facility]

## 2018-02-12 ENCOUNTER — Ambulatory Visit: Payer: Non-veteran care | Admitting: Podiatry

## 2018-04-23 ENCOUNTER — Ambulatory Visit: Payer: Non-veteran care | Admitting: Podiatry

## 2018-05-07 ENCOUNTER — Ambulatory Visit: Payer: Non-veteran care | Admitting: Podiatry

## 2018-05-17 ENCOUNTER — Ambulatory Visit (INDEPENDENT_AMBULATORY_CARE_PROVIDER_SITE_OTHER): Payer: Non-veteran care | Admitting: Podiatry

## 2018-05-17 DIAGNOSIS — M659 Synovitis and tenosynovitis, unspecified: Secondary | ICD-10-CM

## 2018-05-17 DIAGNOSIS — M7671 Peroneal tendinitis, right leg: Secondary | ICD-10-CM

## 2018-05-21 NOTE — Progress Notes (Signed)
HPI: 57 year old male presents the office today for evaluation and follow-up regarding right ankle pain.  Patient was last seen on 01/15/2018 as a new patient for ankle pain to the right lower extremity.  He had reconstructive surgery in 1987 and has a history of Charcot foot surgery in 2018.  He has had constant pain regarding the right ankle.  Last visit anti-inflammatory injections were placed in the patient's right ankle which sent his blood glucose levels over 900 mg/dL.  Patient went to the emergency department at that time for treatment.  Patient also had an MRI performed on 04/04/2018 at [facility].  He presents today to review the MRI results and discuss further treatment options.  Past Medical History:  Diagnosis Date  . Cellulitis    left ankle  . Chronic kidney disease    Renal Insufficiency  . Diabetes mellitus without complication (HCC)   . Heart murmur   . Hypertension   . Hyperthyroidism    in the past  . Hypothyroidism   . Insulin pump in place    Omnipod Insulin Pump  . Neuropathy in diabetes [facility](HCC)      Physical Exam: General: The patient is alert and oriented x3 in no acute distress.  Dermatology: Skin is warm, dry and supple bilateral lower extremities. Negative for open lesions or macerations.  Vascular: Palpable pedal pulses bilaterally. No edema or erythema noted. Capillary refill within normal limits.  Neurological: Epicritic and protective threshold grossly intact bilaterally.   Musculoskeletal Exam: There continues to be pain on palpation to the anterior lateral aspect of the patient's right ankle.  Mild edema noted.  There is also pain on palpation along the peroneal tendon sheath right lower extremity.  There is some limited range of motion to the right ankle joint specifically with dorsiflexion.  Range of motion to all other joints are clinically limited to all pedal and ankle joints bilateral. Muscle strength 5/5 in all groups bilateral.   [facility]: Redemonstration of chronic right ankle injury including a deficient anterior talofibular ligament split peroneus brevis tendon.  Redemonstration of mild Achilles tendinopathy and chronic plantar fasciitis  Overall no significant interval change compared to prior study 2 years ago.  Assessment: 1.  Peroneal tendon tear right lower extremity 2.  Chronic right ankle injury   Plan of Care:  1. Patient evaluated.  MRI reviewed today 2.  Today we discussed surgical options for the patient.  Patient is considering surgery at this time.  We also discussed additional conservative treatment modalities. 3.  The patient can no longer receive any anti-inflammatory steroid injection due to significant increase in blood glucose levels 4.  If the patient were to undergo surgery he would likely need to undergo ATFL repair as well as Promius brevis repair right lower extremity 5.  The patient will return to clinic in November for possible surgical consult. 6.  Return to clinic as needed      Felecia ShellingBrent M. Emerald Shor, DPM [facility]  Dr. Felecia ShellingBrent M. Odalis Jordan, DPM    [address]                                        , [place] [address]                Office [phone]  Fax [phone]) [phone]

## 2018-08-09 ENCOUNTER — Encounter: Payer: Self-pay | Admitting: Podiatry

## 2018-08-09 ENCOUNTER — Ambulatory Visit (INDEPENDENT_AMBULATORY_CARE_PROVIDER_SITE_OTHER): Payer: No Typology Code available for payment source | Admitting: Podiatry

## 2018-08-09 DIAGNOSIS — M659 Synovitis and tenosynovitis, unspecified: Secondary | ICD-10-CM | POA: Diagnosis not present

## 2018-08-09 DIAGNOSIS — M7671 Peroneal tendinitis, right leg: Secondary | ICD-10-CM | POA: Diagnosis not present

## 2018-08-09 DIAGNOSIS — M25371 Other instability, right ankle: Secondary | ICD-10-CM | POA: Diagnosis not present

## 2018-08-09 DIAGNOSIS — M65971 Unspecified synovitis and tenosynovitis, right ankle and foot: Secondary | ICD-10-CM

## 2018-08-09 NOTE — Patient Instructions (Signed)
Pre-Operative Instructions  Congratulations, you have decided to take an important step towards improving your quality of life.  You can be assured that the doctors and staff at [facility] will be with you every step of the way.  Here are some important things you should know:  1. Plan to be at the surgery center/hospital at least 1 (one) hour prior to your scheduled time, unless otherwise directed by the surgical center/hospital staff.  You must have a responsible adult accompany you, remain during the surgery and drive you home.  Make sure you have directions to the surgical center/hospital to ensure you arrive on time. 2. If you are having surgery at [facility] or [facility] hospitals, you will need a copy of your medical history and physical form from your family physician within one month prior to the date of surgery. We will give you a form for your primary physician to complete.  3. We make every effort to accommodate the date you request for surgery.  However, there are times where surgery dates or times have to be moved.  We will contact you as soon as possible if a change in schedule is required.   4. No aspirin/ibuprofen for one week before surgery.  If you are on aspirin, any non-steroidal anti-inflammatory medications (Mobic, Aleve, Ibuprofen) should not be taken seven (7) days prior to your surgery.  You make take Tylenol for pain prior to surgery.  5. Medications - If you are taking daily heart and blood pressure medications, seizure, reflux, allergy, asthma, anxiety, pain or diabetes medications, make sure you notify the surgery center/hospital before the day of surgery so they can tell you which medications you should take or avoid the day of surgery. 6. No food or drink after midnight the night before surgery unless directed otherwise by surgical center/hospital staff. 7. No alcoholic beverages 24-hours prior to surgery.  No smoking 24-hours prior or 24-hours after  surgery. 8. Wear loose pants or shorts. They should be loose enough to fit over bandages, boots, and casts. 9. Don't wear slip-on shoes. Sneakers are preferred. 10. Bring your boot with you to the surgery center/hospital.  Also bring crutches or a walker if your physician has prescribed it for you.  If you do not have this equipment, it will be provided for you after surgery. 11. If you have not been contacted by the surgery center/hospital by the day before your surgery, call to confirm the date and time of your surgery. 12. Leave-time from work may vary depending on the type of surgery you have.  Appropriate arrangements should be made prior to surgery with your employer. 13. Prescriptions will be provided immediately following surgery by your doctor.  Fill these as soon as possible after surgery and take the medication as directed. Pain medications will not be refilled on weekends and must be approved by the doctor. 14. Remove nail polish on the operative foot and avoid getting pedicures prior to surgery. 15. Wash the night before surgery.  The night before surgery wash the foot and leg well with water and the antibacterial soap provided. Be sure to pay special attention to beneath the toenails and in between the toes.  Wash for at least three (3) minutes. Rinse thoroughly with water and dry well with a towel.  Perform this wash unless told not to do so by your physician.  Enclosed: 1 Ice pack (please put in freezer the night before surgery)   1 Hibiclens skin cleaner     Pre-op instructions  If you have any questions regarding the instructions, please do not hesitate to call our office.  [place]: [address], [place], [place] [address] -- [phone]  [place]: [address] [place], [place] [address] -- [phone]  [place]: [address]  [place], [place] [address] -- [phone]  [place]: [address], [place], [place] [address] -- [phone]  Website:  [email or web address] 

## 2018-08-12 NOTE — Progress Notes (Signed)
   HPI: 57 year old male presenting today for follow up evaluation of right foot and ankle pain. He states his pain is still present and has not changed since his previous visit. He is here to talk about surgical intervention at this time. Patient is here for further evaluation and treatment.   Past Medical History:  Diagnosis Date  . Cellulitis    left ankle  . Chronic kidney disease    Renal Insufficiency  . Diabetes mellitus without complication (HCC)   . Heart murmur   . Hypertension   . Hyperthyroidism    in the past  . Hypothyroidism   . Insulin pump in place    Omnipod Insulin Pump  . Neuropathy in diabetes [facility](HCC)      Physical Exam: General: The patient is alert and oriented x3 in no acute distress.  Dermatology: Skin is warm, dry and supple bilateral lower extremities. Negative for open lesions or macerations.  Vascular: Palpable pedal pulses bilaterally. No edema or erythema noted. Capillary refill within normal limits.  Neurological: Epicritic and protective threshold grossly intact bilaterally.   Musculoskeletal Exam: There continues to be pain on palpation to the anterior and lateral aspects of the patient's right ankle. Mild edema noted. There is also pain on palpation along the peroneal tendon sheath right lower extremity. There is some limited range of motion to the right ankle joint specifically with dorsiflexion. Range of motion to all other joints are clinically limited to all pedal and ankle joints bilateral. Muscle strength 5/5 in all groups bilateral.    Assessment: 1. ATFL tear right 2. Peroneal tendinitis right  3. Ankle DJD/synovitis right  Plan of Care:  1. Patient evaluated.   2. Today we discussed the conservative versus surgical management of the presenting pathology. The patient opts for surgical management. All possible complications and details of the procedure were explained. All patient questions were answered. No guarantees were expressed or  implied. 3. Authorization for surgery was initiated today. Surgery will consist of ankle arthroscopy right; repair ATFL ligament right; possible repair of peroneal tendon right.  4. Return to clinic one week post op.      Felecia ShellingBrent M. Evans, DPM [facility]  Dr. Felecia ShellingBrent M. Evans, DPM    [address]                                        , [place] [address]                Office [phone]) [phone]  Fax [phone](336) [phone]

## 2018-10-24 ENCOUNTER — Telehealth: Payer: Self-pay | Admitting: *Deleted

## 2018-10-24 NOTE — Telephone Encounter (Signed)
I mailed Jeffrey Medina the history and physical form, so he can take them to his primary care physician.  I informed him that his surgery will be on December 19, 2018 at [facility].

## 2018-10-24 NOTE — Telephone Encounter (Addendum)
"  Mr. Jeffrey Medina has called today and stated he has not been scheduled for surgery yet.  He said, "What in the hell is going on!"  He said he has called the [place] and they said they have not heard from you all about me having surgery."  He was supposed to call me for the date.  "Is it okay to give him your number so you can talk to him?"  That will be fine.  "I am calling you to schedule my surgery.  I was told you will call me once you get authorization from the [place]."  The process is to go ahead and get a date for your surgery.  I have to look at your referral from the [place], to see if the surgical codes are listed.  If they are not listed, I have to contact the [facility] to get authorization.  If it needs authorization, this could be a long process.  You may have been given a brochure for [facility]  They are not in-network with the [place].  Your surgery has to be done at [facility]  They require you to have a history and physical form completed by your primary care physician.  The physical has to be done within a 30 days time span of your surgery date.  Would you like to go ahead and schedule your surgery?  "Dr. Logan Bores' next available date is December 19, 2018. "That date will be fine.  Will you send me the paperwork or do I have to go by your office to pick it up?"  I can mail it to you.  "Okay, thank you for your help."  Marchelle Folks put in a request with the [facility] for extended visits and to have the surgical codes authorized.

## 2018-12-11 ENCOUNTER — Telehealth: Payer: Self-pay | Admitting: *Deleted

## 2018-12-11 NOTE — Telephone Encounter (Signed)
"  Jeffrey Medina is scheduled for outpatient surgery at [facility] on March 26.  He has the [employer].  Do you have the authorization for the surgery?"  We're working on it.  The [facility] is supposed to be sending it over.  "Can you let me know when you get it?"  Yes, I'll let you know.

## 2018-12-11 NOTE — Telephone Encounter (Signed)
"  I'm calling in regards to Jeffrey Medina.  He's scheduled for surgery for Podiatry.  I was returning the call to Marchelle Folks in regards to this [facility].  I wanted to let you know that there's a Podiatry authorization that was uploaded to [facility].  The authorization number is [id number].  The number to [facility] customer service is [phone].  Please give [facility] a call so you can get his surgical procedure authorized.  If you need to speak to me about any questions or concerns feel free to reach out to me at [facility].  When you call, ask to speak to [place]."

## 2018-12-11 NOTE — Telephone Encounter (Signed)
"  I'm calling  about my surgery that's scheduled for next week."  I was getting ready to call you.  We need to reschedule your surgery due to a policy [facility] has implemented.  "I could not have it any way.  My doctor has not completed my history and physical form.  I'd like to reschedule it to the end of May."  Dr. Logan Bores can do May 21.  "May 21 will be fine."  Please make sure you get the history and physical form completed between April 21 to May 21.  "I will."

## 2018-12-16 ENCOUNTER — Telehealth: Payer: Self-pay | Admitting: *Deleted

## 2018-12-16 NOTE — Telephone Encounter (Signed)
"  I'm calling regarding patient, Jeffrey Medina.  He's having surgery on March 26.  I went into Epic and saw an authorization number you put in there.  I just want to confirm that it's approved.  Call me back and let me know if it's been approved so I can complete his account.  Thank you."

## 2018-12-19 ENCOUNTER — Ambulatory Visit (HOSPITAL_COMMUNITY)
Admission: RE | Admit: 2018-12-19 | Payer: No Typology Code available for payment source | Source: Home / Self Care | Admitting: Podiatry

## 2018-12-19 ENCOUNTER — Encounter (HOSPITAL_COMMUNITY): Admission: RE | Payer: Self-pay | Source: Home / Self Care

## 2018-12-19 SURGERY — ARTHROSCOPY, ANKLE
Anesthesia: General | Laterality: Right

## 2018-12-25 NOTE — Telephone Encounter (Signed)
I received papers in the mail that my surgery was authorized.  Did you get it?"  Yes, I received it.  It says it is valid from December 11, 2018 to June 09, 2019.  "My copy has a different date on it.  Do you want a copy of this one?"  Sure, that will be fine.  "I'll drop it off by the [facility] office."  I will make sure all the cpt codes are on the authorization sheet.

## 2019-02-03 ENCOUNTER — Telehealth: Payer: Self-pay | Admitting: *Deleted

## 2019-02-03 NOTE — Telephone Encounter (Signed)
"  I'm calling to see if my surgery is still on for May 21."  Yes, you are still scheduled for Feb 13, 2019.  Your surgery will be done at [facility].  "I had my physical done.  Do I give that to them the morning of my surgery or do I need to do something else?"  Can you send it to me?  "Yes, I can fax it to you.  Do you just need that one sheet or do I fax it all to you?"  You can send that sheet as well as labs or any pertinent information.  "What's your fax number?"  My fax number is [phone].  "I'll send it later this afternoon.  You'll get it to the surgery center, right?"  Yes, I will send the information to the surgery center.

## 2019-02-07 ENCOUNTER — Telehealth: Payer: Self-pay | Admitting: *Deleted

## 2019-02-07 ENCOUNTER — Encounter (HOSPITAL_BASED_OUTPATIENT_CLINIC_OR_DEPARTMENT_OTHER): Payer: Self-pay | Admitting: *Deleted

## 2019-02-07 ENCOUNTER — Other Ambulatory Visit: Payer: Self-pay

## 2019-02-07 NOTE — Telephone Encounter (Signed)
"  I'm checking to see if you have an authorization from the [place].  Do you have an authorization number for his surgery that's scheduled for I think Feb 13, 2019 at the [facility] with Dr. Logan Bores?  If you could, call me back.  I returned her call and informed her that [facility] authorization number for his surgery scheduled for 02/13/2019 is [id number] and it's valid from Feb 13, 2019 to 08/12/2019.

## 2019-02-10 ENCOUNTER — Other Ambulatory Visit: Payer: Self-pay

## 2019-02-10 ENCOUNTER — Telehealth: Payer: Self-pay | Admitting: *Deleted

## 2019-02-10 ENCOUNTER — Other Ambulatory Visit (HOSPITAL_COMMUNITY)
Admission: RE | Admit: 2019-02-10 | Discharge: 2019-02-10 | Disposition: A | Discharge status: Home / Self Care | Payer: No Typology Code available for payment source | Source: Ambulatory Visit | Attending: Podiatry | Admitting: Podiatry

## 2019-02-10 ENCOUNTER — Encounter (HOSPITAL_BASED_OUTPATIENT_CLINIC_OR_DEPARTMENT_OTHER)
Admission: RE | Admit: 2019-02-10 | Discharge: 2019-02-10 | Disposition: A | Discharge status: Home / Self Care | Payer: No Typology Code available for payment source | Source: Ambulatory Visit | Attending: Podiatry | Admitting: Podiatry

## 2019-02-10 DIAGNOSIS — Z1159 Encounter for screening for other viral diseases: Secondary | ICD-10-CM | POA: Insufficient documentation

## 2019-02-10 DIAGNOSIS — S93491A Sprain of other ligament of right ankle, initial encounter: Secondary | ICD-10-CM | POA: Diagnosis not present

## 2019-02-10 DIAGNOSIS — M7671 Peroneal tendinitis, right leg: Secondary | ICD-10-CM | POA: Diagnosis not present

## 2019-02-10 DIAGNOSIS — E1022 Type 1 diabetes mellitus with diabetic chronic kidney disease: Secondary | ICD-10-CM | POA: Diagnosis not present

## 2019-02-10 DIAGNOSIS — Z794 Long term (current) use of insulin: Secondary | ICD-10-CM | POA: Diagnosis not present

## 2019-02-10 DIAGNOSIS — E1042 Type 1 diabetes mellitus with diabetic polyneuropathy: Secondary | ICD-10-CM | POA: Diagnosis not present

## 2019-02-10 DIAGNOSIS — E1065 Type 1 diabetes mellitus with hyperglycemia: Secondary | ICD-10-CM | POA: Diagnosis not present

## 2019-02-10 DIAGNOSIS — Z01812 Encounter for preprocedural laboratory examination: Secondary | ICD-10-CM | POA: Insufficient documentation

## 2019-02-10 DIAGNOSIS — E669 Obesity, unspecified: Secondary | ICD-10-CM | POA: Diagnosis not present

## 2019-02-10 DIAGNOSIS — M659 Synovitis and tenosynovitis, unspecified: Secondary | ICD-10-CM | POA: Diagnosis present

## 2019-02-10 DIAGNOSIS — Z6833 Body mass index (BMI) 33.0-33.9, adult: Secondary | ICD-10-CM | POA: Diagnosis not present

## 2019-02-10 DIAGNOSIS — S86311A Strain of muscle(s) and tendon(s) of peroneal muscle group at lower leg level, right leg, initial encounter: Secondary | ICD-10-CM | POA: Diagnosis not present

## 2019-02-10 DIAGNOSIS — N183 Chronic kidney disease, stage 3 (moderate): Secondary | ICD-10-CM | POA: Diagnosis not present

## 2019-02-10 DIAGNOSIS — I129 Hypertensive chronic kidney disease with stage 1 through stage 4 chronic kidney disease, or unspecified chronic kidney disease: Secondary | ICD-10-CM | POA: Diagnosis not present

## 2019-02-10 DIAGNOSIS — M19071 Primary osteoarthritis, right ankle and foot: Secondary | ICD-10-CM | POA: Diagnosis not present

## 2019-02-10 DIAGNOSIS — X58XXXA Exposure to other specified factors, initial encounter: Secondary | ICD-10-CM | POA: Diagnosis not present

## 2019-02-10 LAB — BASIC METABOLIC PANEL
Anion gap: 9 (ref 5–15)
BUN: 22 mg/dL — ABNORMAL HIGH (ref 6–20)
CO2: 27 mmol/L (ref 22–32)
Calcium: 9.3 mg/dL (ref 8.9–10.3)
Chloride: 100 mmol/L (ref 98–111)
Creatinine, Ser: 2.11 mg/dL — ABNORMAL HIGH (ref 0.61–1.24)
GFR calc Af Amer: 40 mL/min — ABNORMAL LOW (ref >60)
GFR calc non Af Amer: 34 mL/min — ABNORMAL LOW (ref >60)
Glucose, Bld: 292 mg/dL — ABNORMAL HIGH (ref 70–99)
Potassium: 4.5 mmol/L (ref 3.5–5.1)
Sodium: 136 mmol/L (ref 135–145)

## 2019-02-10 NOTE — Telephone Encounter (Signed)
"  I'm calling in regards to Jeffrey Medina.  He's scheduled for surgery with Dr. Logan Bores on Thursday, 21st.  I just want to touch base with you guys and let you know that his CBG came back from his bloodwork earlier this morning at 292.  This is a type one diabetic, he has an insulin pump.  I spoke with him and he's aware that if he's 300 or greater the day of surgery, he will be canceled.  He said it's only normally this high when he comes in for an appointment or labs.  I do see another reading of 331 on May 12, no that was back in 2018.  I just wanted to let you know.  If you have any questions let me know."

## 2019-02-10 NOTE — Progress Notes (Signed)
Pt here for pre-op labs. EKG completed- NSR. CMP drawn and placed in fridge for pick up. PPE for pt placed in brown paper bag and left in designated box in pre op area.

## 2019-02-10 NOTE — Telephone Encounter (Signed)
"  My husband, Jeffrey Medina, is scheduled for surgery on Thursday.  His surgery starts at 1 pm."  No, his surgery starts at 1:45 pm.  "Okay, I didn't have my notes in front of me.  The question I need to ask is can his surgery start any earlier?"  His surgery cannot start any sooner.  Dr. Logan Bores has surgeries scheduled that morning at [facility].  "The reason I was asking is because my husband is Diabetic and that's a long time for him not to eat."  They normally recommend nothing to eat or drink six to eight hours prior to the surgery time.  "Well the nurse that called said nothing to eat after midnight."  Well ask the pre-op nurse when she calls.

## 2019-02-10 NOTE — Progress Notes (Signed)
BMP resulted- pts glucose non fasting, although while still on his insulin pump came back at 292. I reviewed these results my ologist Dr. Acey Lav, who advised that I touch base with the pt to see if has normally or has recently been running this high even with his pump and to make pt aware that if his CBG DOS is 300 or greater that his surgery would be canceled.   I spoke with the pt can he stated that he "does not normally run that high," and he states t is only that high when I have to come in for labs or something like that." Again, I made sure that the pt understand that if comes in DOS and his CBG is 300 or greater that his surgery would be canceled. Pt stated his understanding. Will proceed with surgery.

## 2019-02-10 NOTE — Progress Notes (Signed)
Called Dr. Logan Bores office at [phone] and left a message for the surgical coordinator Delydia making her aware of the pts CBG results of 292 today. Asked her to call us back at [phone] if they have any other questions or concerns we can address regarding this pt.

## 2019-02-11 LAB — NOVEL CORONAVIRUS, NAA (HOSP ORDER, SEND-OUT TO REF LAB; TAT 18-24 HRS): SARS-CoV-2, NAA: NOT DETECTED

## 2019-02-13 ENCOUNTER — Ambulatory Visit (HOSPITAL_BASED_OUTPATIENT_CLINIC_OR_DEPARTMENT_OTHER)
Admission: RE | Admit: 2019-02-13 | Discharge: 2019-02-13 | Disposition: A | Discharge status: Home / Self Care | Payer: No Typology Code available for payment source | Attending: Podiatry | Admitting: Podiatry

## 2019-02-13 ENCOUNTER — Ambulatory Visit (HOSPITAL_BASED_OUTPATIENT_CLINIC_OR_DEPARTMENT_OTHER): Payer: No Typology Code available for payment source | Admitting: Anesthesiology

## 2019-02-13 ENCOUNTER — Encounter (HOSPITAL_BASED_OUTPATIENT_CLINIC_OR_DEPARTMENT_OTHER): Payer: Self-pay | Admitting: *Deleted

## 2019-02-13 ENCOUNTER — Other Ambulatory Visit: Payer: Self-pay

## 2019-02-13 ENCOUNTER — Encounter (HOSPITAL_BASED_OUTPATIENT_CLINIC_OR_DEPARTMENT_OTHER)
Admission: RE | Disposition: A | Discharge status: Home / Self Care | Payer: Self-pay | Source: Home / Self Care | Attending: Podiatry

## 2019-02-13 DIAGNOSIS — E669 Obesity, unspecified: Secondary | ICD-10-CM | POA: Insufficient documentation

## 2019-02-13 DIAGNOSIS — Z6833 Body mass index (BMI) 33.0-33.9, adult: Secondary | ICD-10-CM | POA: Insufficient documentation

## 2019-02-13 DIAGNOSIS — E1065 Type 1 diabetes mellitus with hyperglycemia: Secondary | ICD-10-CM | POA: Insufficient documentation

## 2019-02-13 DIAGNOSIS — M7671 Peroneal tendinitis, right leg: Secondary | ICD-10-CM | POA: Diagnosis not present

## 2019-02-13 DIAGNOSIS — M19071 Primary osteoarthritis, right ankle and foot: Secondary | ICD-10-CM | POA: Insufficient documentation

## 2019-02-13 DIAGNOSIS — X58XXXA Exposure to other specified factors, initial encounter: Secondary | ICD-10-CM | POA: Insufficient documentation

## 2019-02-13 DIAGNOSIS — M65171 Other infective (teno)synovitis, right ankle and foot: Secondary | ICD-10-CM

## 2019-02-13 DIAGNOSIS — M659 Synovitis and tenosynovitis, unspecified: Secondary | ICD-10-CM | POA: Diagnosis not present

## 2019-02-13 DIAGNOSIS — S93491S Sprain of other ligament of right ankle, sequela: Secondary | ICD-10-CM

## 2019-02-13 DIAGNOSIS — S93491A Sprain of other ligament of right ankle, initial encounter: Secondary | ICD-10-CM | POA: Insufficient documentation

## 2019-02-13 DIAGNOSIS — S86311A Strain of muscle(s) and tendon(s) of peroneal muscle group at lower leg level, right leg, initial encounter: Secondary | ICD-10-CM | POA: Diagnosis not present

## 2019-02-13 DIAGNOSIS — I129 Hypertensive chronic kidney disease with stage 1 through stage 4 chronic kidney disease, or unspecified chronic kidney disease: Secondary | ICD-10-CM | POA: Insufficient documentation

## 2019-02-13 DIAGNOSIS — E1042 Type 1 diabetes mellitus with diabetic polyneuropathy: Secondary | ICD-10-CM | POA: Insufficient documentation

## 2019-02-13 DIAGNOSIS — N183 Chronic kidney disease, stage 3 (moderate): Secondary | ICD-10-CM | POA: Insufficient documentation

## 2019-02-13 DIAGNOSIS — Z794 Long term (current) use of insulin: Secondary | ICD-10-CM | POA: Insufficient documentation

## 2019-02-13 DIAGNOSIS — E1022 Type 1 diabetes mellitus with diabetic chronic kidney disease: Secondary | ICD-10-CM | POA: Insufficient documentation

## 2019-02-13 DIAGNOSIS — M2011 Hallux valgus (acquired), right foot: Secondary | ICD-10-CM | POA: Diagnosis not present

## 2019-02-13 HISTORY — PX: ANKLE ARTHROSCOPY: SHX545

## 2019-02-13 HISTORY — PX: TENDON REPAIR: SHX5111

## 2019-02-13 HISTORY — PX: LIGAMENT REPAIR: SHX5444

## 2019-02-13 HISTORY — DX: Other seasonal allergic rhinitis: J30.2

## 2019-02-13 LAB — GLUCOSE, CAPILLARY: Glucose-Capillary: 86 mg/dL (ref 70–99)

## 2019-02-13 SURGERY — ARTHROSCOPY, ANKLE
Anesthesia: Monitor Anesthesia Care | Site: Ankle | Laterality: Right

## 2019-02-13 MED ORDER — ONDANSETRON HCL 4 MG/2ML IJ SOLN: 4.0000 mg | Freq: Once | INTRAMUSCULAR | Status: DC | PRN

## 2019-02-13 MED ORDER — FENTANYL CITRATE (PF) 100 MCG/2ML IJ SOLN
50.0000 ug | INTRAMUSCULAR | Status: DC | PRN
  Administered 2019-02-13 (×2): 50 ug via INTRAVENOUS

## 2019-02-13 MED ORDER — PROPOFOL 500 MG/50ML IV EMUL
INTRAVENOUS | Status: DC | PRN
  Administered 2019-02-13: 100 ug/kg/min via INTRAVENOUS

## 2019-02-13 MED ORDER — CHLORHEXIDINE GLUCONATE 4 % EX LIQD: 60.0000 mL | Freq: Once | CUTANEOUS | Status: DC

## 2019-02-13 MED ORDER — LIDOCAINE HCL 2 % IJ SOLN
INTRAMUSCULAR | Status: AC
  Filled 2019-02-13: qty 20

## 2019-02-13 MED ORDER — BUPIVACAINE-EPINEPHRINE (PF) 0.5% -1:200000 IJ SOLN
INTRAMUSCULAR | Status: DC | PRN
  Administered 2019-02-13 (×2): 25 mL via PERINEURAL

## 2019-02-13 MED ORDER — LIDOCAINE 2% (20 MG/ML) 5 ML SYRINGE
INTRAMUSCULAR | Status: AC
  Filled 2019-02-13: qty 5

## 2019-02-13 MED ORDER — INSULIN ASPART 100 UNIT/ML ~~LOC~~ SOLN
SUBCUTANEOUS | Status: AC
  Filled 2019-02-13: qty 1

## 2019-02-13 MED ORDER — LACTATED RINGERS IV SOLN
INTRAVENOUS | Status: DC
  Administered 2019-02-13: 12:00:00 via INTRAVENOUS

## 2019-02-13 MED ORDER — PROPOFOL 10 MG/ML IV BOLUS
INTRAVENOUS | Status: DC | PRN
  Administered 2019-02-13: 20 mg via INTRAVENOUS

## 2019-02-13 MED ORDER — PROPOFOL 500 MG/50ML IV EMUL
INTRAVENOUS | Status: AC
  Filled 2019-02-13: qty 50

## 2019-02-13 MED ORDER — LIDOCAINE 2% (20 MG/ML) 5 ML SYRINGE
INTRAMUSCULAR | Status: DC | PRN
  Administered 2019-02-13: 5 mg via INTRAVENOUS

## 2019-02-13 MED ORDER — INSULIN ASPART 100 UNIT/ML ~~LOC~~ SOLN
SUBCUTANEOUS | Status: DC | PRN
  Administered 2019-02-13: 2 [IU] via SUBCUTANEOUS

## 2019-02-13 MED ORDER — FENTANYL CITRATE (PF) 100 MCG/2ML IJ SOLN
INTRAMUSCULAR | Status: AC
  Filled 2019-02-13: qty 2

## 2019-02-13 MED ORDER — EPHEDRINE 5 MG/ML INJ
INTRAVENOUS | Status: AC
  Filled 2019-02-13: qty 10

## 2019-02-13 MED ORDER — MIDAZOLAM HCL 2 MG/2ML IJ SOLN
1.0000 mg | INTRAMUSCULAR | Status: DC | PRN
  Administered 2019-02-13 (×2): 2 mg via INTRAVENOUS

## 2019-02-13 MED ORDER — BUPIVACAINE HCL (PF) 0.5 % IJ SOLN
INTRAMUSCULAR | Status: AC
  Filled 2019-02-13: qty 30

## 2019-02-13 MED ORDER — MEPERIDINE HCL 25 MG/ML IJ SOLN: 6.2500 mg | INTRAMUSCULAR | Status: DC | PRN

## 2019-02-13 MED ORDER — SCOPOLAMINE 1 MG/3DAYS TD PT72: 1.0000 | MEDICATED_PATCH | Freq: Once | TRANSDERMAL | Status: DC | PRN

## 2019-02-13 MED ORDER — SODIUM CHLORIDE 0.9 % IR SOLN
Status: DC | PRN
  Administered 2019-02-13: 1500 mL

## 2019-02-13 MED ORDER — CEFAZOLIN SODIUM-DEXTROSE 2-4 GM/100ML-% IV SOLN
INTRAVENOUS | Status: AC
  Filled 2019-02-13: qty 100

## 2019-02-13 MED ORDER — MIDAZOLAM HCL 2 MG/2ML IJ SOLN
INTRAMUSCULAR | Status: AC
  Filled 2019-02-13: qty 2

## 2019-02-13 MED ORDER — HYDROCODONE-ACETAMINOPHEN 7.5-325 MG PO TABS: 1.0000 | ORAL_TABLET | Freq: Once | ORAL | Status: DC | PRN

## 2019-02-13 MED ORDER — PHENYLEPHRINE 40 MCG/ML (10ML) SYRINGE FOR IV PUSH (FOR BLOOD PRESSURE SUPPORT)
PREFILLED_SYRINGE | INTRAVENOUS | Status: AC
  Filled 2019-02-13: qty 10

## 2019-02-13 MED ORDER — FENTANYL CITRATE (PF) 100 MCG/2ML IJ SOLN: 25.0000 ug | INTRAMUSCULAR | Status: DC | PRN

## 2019-02-13 MED ORDER — ONDANSETRON HCL 4 MG/2ML IJ SOLN
INTRAMUSCULAR | Status: DC | PRN
  Administered 2019-02-13: 4 mg via INTRAVENOUS

## 2019-02-13 MED ORDER — DEXTROSE 5 % IV SOLN
3.0000 g | INTRAVENOUS | Status: AC
  Administered 2019-02-13: 2 g via INTRAVENOUS

## 2019-02-13 MED ORDER — SUCCINYLCHOLINE CHLORIDE 200 MG/10ML IV SOSY
PREFILLED_SYRINGE | INTRAVENOUS | Status: AC
  Filled 2019-02-13: qty 10

## 2019-02-13 SURGICAL SUPPLY — 72 items
BANDAGE ACE 3X5.8 VEL STRL LF (GAUZE/BANDAGES/DRESSINGS) ×3 IMPLANT
BANDAGE ACE 4X5 VEL STRL LF (GAUZE/BANDAGES/DRESSINGS) ×6 IMPLANT
BLADE SURG 15 STRL LF DISP TIS (BLADE) ×2 IMPLANT
BLADE SURG 15 STRL SS (BLADE) ×4
BNDG CONFORM 2 STRL LF (GAUZE/BANDAGES/DRESSINGS) ×3 IMPLANT
BNDG ESMARK 4X9 LF (GAUZE/BANDAGES/DRESSINGS) ×3 IMPLANT
BNDG GAUZE ELAST 4 BULKY (GAUZE/BANDAGES/DRESSINGS) ×3 IMPLANT
BOOT STEPPER DURA LG (SOFTGOODS) ×2 IMPLANT
BRACE INTERNAL LIGIMENT PEEK (Anchor) ×2 IMPLANT
COVER BACK TABLE REUSABLE LG (DRAPES) ×3 IMPLANT
COVER WAND RF STERILE (DRAPES) IMPLANT
CUFF TOURN SGL QUICK 18X4 (TOURNIQUET CUFF) ×4 IMPLANT
CUFF TOURN SGL QUICK 34 (TOURNIQUET CUFF)
CUFF TRNQT CYL 34X4.125X (TOURNIQUET CUFF) IMPLANT
DRAPE EXTREMITY T 121X128X90 (DISPOSABLE) ×3 IMPLANT
DRAPE IMP U-DRAPE 54X76 (DRAPES) ×3 IMPLANT
DRAPE OEC MINIVIEW 54X84 (DRAPES) IMPLANT
DRAPE SURG 17X23 STRL (DRAPES) ×5 IMPLANT
DRAPE U-SHAPE 47X51 STRL (DRAPES) ×3 IMPLANT
DRSG PAD ABDOMINAL 8X10 ST (GAUZE/BANDAGES/DRESSINGS) ×2 IMPLANT
DURAPREP 26ML APPLICATOR (WOUND CARE) ×5 IMPLANT
ELECT REM PT RETURN 9FT ADLT (ELECTROSURGICAL) ×3
ELECTRODE REM PT RTRN 9FT ADLT (ELECTROSURGICAL) ×1 IMPLANT
GAUZE 4X4 16PLY RFD (DISPOSABLE) IMPLANT
GAUZE SPONGE 4X4 12PLY STRL (GAUZE/BANDAGES/DRESSINGS) ×3 IMPLANT
GAUZE XEROFORM 1X8 LF (GAUZE/BANDAGES/DRESSINGS) ×3 IMPLANT
GLOVE BIO SURGEON STRL SZ7.5 (GLOVE) ×6 IMPLANT
GLOVE BIO SURGEON STRL SZ8 (GLOVE) ×2 IMPLANT
GLOVE BIOGEL PI IND STRL 7.5 (GLOVE) IMPLANT
GLOVE BIOGEL PI IND STRL 8 (GLOVE) IMPLANT
GLOVE BIOGEL PI INDICATOR 7.5 (GLOVE) ×4
GLOVE BIOGEL PI INDICATOR 8 (GLOVE) ×2
GOWN STRL REUS W/ TWL LRG LVL3 (GOWN DISPOSABLE) ×1 IMPLANT
GOWN STRL REUS W/ TWL XL LVL3 (GOWN DISPOSABLE) ×1 IMPLANT
GOWN STRL REUS W/TWL LRG LVL3 (GOWN DISPOSABLE) ×2
GOWN STRL REUS W/TWL XL LVL3 (GOWN DISPOSABLE) ×2
MANIFOLD NEPTUNE II (INSTRUMENTS) ×3 IMPLANT
NDL HYPO 25X1 1.5 SAFETY (NEEDLE) ×1 IMPLANT
NDL SAFETY ECLIPSE 18X1.5 (NEEDLE) IMPLANT
NEEDLE HYPO 18GX1.5 SHARP (NEEDLE)
NEEDLE HYPO 25X1 1.5 SAFETY (NEEDLE) ×3 IMPLANT
NS IRRIG 1000ML POUR BTL (IV SOLUTION) ×2 IMPLANT
PACK BASIN DAY SURGERY FS (CUSTOM PROCEDURE TRAY) ×3 IMPLANT
PADDING CAST ABS 4INX4YD NS (CAST SUPPLIES) ×4
PADDING CAST ABS COTTON 4X4 ST (CAST SUPPLIES) ×2 IMPLANT
PENCIL BUTTON HOLSTER BLD 10FT (ELECTRODE) IMPLANT
SET CYSTO W/LG BORE CLAMP LF (SET/KITS/TRAYS/PACK) ×2 IMPLANT
SET IRRIG Y TYPE TUR BLADDER L (SET/KITS/TRAYS/PACK) ×3 IMPLANT
SHEET MEDIUM DRAPE 40X70 STRL (DRAPES) ×2 IMPLANT
SLEEVE SCD COMPRESS KNEE MED (MISCELLANEOUS) ×3 IMPLANT
SPLINT FIBERGLASS 4X30 (CAST SUPPLIES) ×3 IMPLANT
STAPLER VISISTAT (STAPLE) ×2 IMPLANT
STAPLER VISISTAT 35W (STAPLE) IMPLANT
STOCKINETTE 6  STRL (DRAPES) ×2
STOCKINETTE 6 STRL (DRAPES) ×1 IMPLANT
STRAP ANKLE FOOT DISTRACTOR (ORTHOPEDIC SUPPLIES) ×1 IMPLANT
SUT ETHILON 4 0 PS 2 18 (SUTURE) ×3 IMPLANT
SUT FIBERWIRE 2-0 18 17.9 3/8 (SUTURE) ×3
SUT MNCRL AB 3-0 PS2 18 (SUTURE) ×3 IMPLANT
SUT MNCRL AB 4-0 PS2 18 (SUTURE) ×3 IMPLANT
SUT MON AB 5-0 PS2 18 (SUTURE) IMPLANT
SUT PROLENE 3 0 PS 1 (SUTURE) ×2 IMPLANT
SUT VIC AB 3-0 SH 27 (SUTURE) ×2
SUT VIC AB 3-0 SH 27X BRD (SUTURE) IMPLANT
SUT VICRYL 4-0 PS2 18IN ABS (SUTURE) ×2 IMPLANT
SUTURE FIBERWR 2-0 18 17.9 3/8 (SUTURE) IMPLANT
SYR 10ML LL (SYRINGE) IMPLANT
SYR BULB 3OZ (MISCELLANEOUS) ×3 IMPLANT
TUBE CONNECTING 20'X1/4 (TUBING) ×2
TUBE CONNECTING 20X1/4 (TUBING) ×3 IMPLANT
TUBING ARTHROSCOPY IRRIG 16FT (MISCELLANEOUS) ×2 IMPLANT
UNDERPAD 30X30 (UNDERPADS AND DIAPERS) ×5 IMPLANT

## 2019-02-13 NOTE — Anesthesia Postprocedure Evaluation (Signed)
Anesthesia Post Note  Patient: Jeffrey Medina  Procedure(s) Performed: RIGHT ANKLE ARTHROSCOPY (Right Ankle) RIGHT ANKLE FLEXOR TENDON REPAIR (Right Ankle) RIGHT ANKLE LATERAL COLLATERAL LIGAMENT REPAIR (Right Ankle)     Patient location during evaluation: PACU Anesthesia Type: Regional Level of consciousness: awake and alert and oriented Pain management: pain level controlled Vital Signs Assessment: post-procedure vital signs reviewed and stable Respiratory status: spontaneous breathing, nonlabored ventilation and respiratory function stable Cardiovascular status: blood pressure returned to baseline and stable Postop Assessment: no apparent nausea or vomiting Anesthetic complications: no    Last Vitals:  Vitals:   02/13/19 1607 02/13/19 1615  BP: 125/72 128/80  Pulse: 71 70  Resp: 12 12  Temp:    SpO2: 98% 100%    Last Pain:  Vitals:   02/13/19 1607  TempSrc:   PainSc: 0-No pain                 Ludean Duhart A.

## 2019-02-13 NOTE — H&P (Signed)
Anesthesia H&P Update: History and Physical Exam reviewed; patient is OK for planned anesthetic and procedure. ? ?

## 2019-02-13 NOTE — Anesthesia Procedure Notes (Signed)
Anesthesia Regional Block: Popliteal block   Pre-Anesthetic Checklist: ,, timeout performed, Correct Patient, Correct Site, Correct Laterality, Correct Procedure, Correct Position, site marked, Risks and benefits discussed,  Surgical consent,  Pre-op evaluation,  At surgeon's request and post-op pain management  Laterality: Right  Prep: chloraprep       Needles:  Injection technique: Single-shot  Needle Type: Echogenic Stimulator Needle     Needle Length: 9cm  Needle Gauge: 21   Needle insertion depth: 7 cm   Additional Needles:   Procedures:,,,, ultrasound used (permanent image in chart),,,,  Narrative:  Start time: 02/13/2019 11:55 AM End time: 02/13/2019 12:00 PM Injection made incrementally with aspirations every 5 mL.  Performed by: Personally  Anesthesiologist: Mal Amabile, MD  Additional Notes: Timeout performed. Patient sedated. Relevant anatomy ID'd using [place]. Incremental 2-11ml injection of LA with frequent aspiration. Patient tolerated procedure well.        Right Popliteal Block

## 2019-02-13 NOTE — Discharge Instructions (Signed)
Regional Anesthesia Blocks ? ?1. Numbness or the inability to move the "blocked" extremity may last from 3-48 hours after placement. The length of time depends on the medication injected and your individual response to the medication. If the numbness is not going away after 48 hours, call your surgeon. ? ?2. The extremity that is blocked will need to be protected until the numbness is gone and the  Strength has returned. Because you cannot feel it, you will need to take extra care to avoid injury. Because it may be weak, you may have difficulty moving it or using it. You may not know what position it is in without looking at it while the block is in effect. ? ?3. For blocks in the legs and feet, returning to weight bearing and walking needs to be done carefully. You will need to wait until the numbness is entirely gone and the strength has returned. You should be able to move your leg and foot normally before you try and bear weight or walk. You will need someone to be with you when you first try to ensure you do not fall and possibly risk injury. ? ?4. Bruising and tenderness at the needle site are common side effects and will resolve in a few days. ? ?5. Persistent numbness or new problems with movement should be communicated to the surgeon or the [facility] ([phone])/ [facility] ([phone]).  ? ?Post Anesthesia Home Care Instructions ? ?Activity: ?Get plenty of rest for the remainder of the day. A responsible individual must stay with you for 24 hours following the procedure.  ?For the next 24 hours, DO NOT: ?-Drive a car ?-Operate machinery ?-Drink alcoholic beverages ?-Take any medication unless instructed by your physician ?-Make any legal decisions or sign important papers. ? ?Meals: ?Start with liquid foods such as gelatin or soup. Progress to regular foods as tolerated. Avoid greasy, spicy, heavy foods. If nausea and/or vomiting occur, drink only clear liquids until the  nausea and/or vomiting subsides. Call your physician if vomiting continues. ? ?Special Instructions/Symptoms: ?Your throat may feel dry or sore from the anesthesia or the breathing tube placed in your throat during surgery. If this causes discomfort, gargle with warm salt water. The discomfort should disappear within 24 hours. ? ?If you had a scopolamine patch placed behind your ear for the management of post- operative nausea and/or vomiting: ? ?1. The medication in the patch is effective for 72 hours, after which it should be removed.  Wrap patch in a tissue and discard in the trash. Wash hands thoroughly with soap and water. ?2. You may remove the patch earlier than 72 hours if you experience unpleasant side effects which may include dry mouth, dizziness or visual disturbances. ?3. Avoid touching the patch. Wash your hands with soap and water after contact with the patch. ?    ?

## 2019-02-13 NOTE — Brief Op Note (Signed)
02/13/2019  4:59 PM  PATIENT:  Jeffrey Medina  58 y.o. male  PRE-OPERATIVE DIAGNOSIS:  SYNOVITIS OF RIGHT ANKLE, PERONEAL TENDONITIS AND RIGHT ANKLE INSTABILITY  POST-OPERATIVE DIAGNOSIS:  SYNOVITIS OF RIGHT ANKLE, PERONEAL TENDONITIS AND RIGHT ANKLE INSTABILITY  PROCEDURE:  Procedure(s): RIGHT ANKLE ARTHROSCOPY (Right) RIGHT ANKLE FLEXOR TENDON REPAIR (Right) RIGHT ANKLE LATERAL COLLATERAL LIGAMENT REPAIR (Right)  SURGEON:  Surgeon(s) and Role:    Felecia Shelling, DPM - Primary  PHYSICIAN ASSISTANT:   ASSISTANTS: none   ANESTHESIA:   regional  EBL:  5 mL   BLOOD ADMINISTERED:none  DRAINS: none   LOCAL MEDICATIONS USED:  NONE  SPECIMEN:  No Specimen  DISPOSITION OF SPECIMEN:  N/A  COUNTS:  YES  TOURNIQUET:   Total Tourniquet Time Documented: Calf (Right) - 71 minutes Total: Calf (Right) - 71 minutes   DICTATION: .Jeffrey Medina Dictation  PLAN OF CARE: Discharge to home after PACU  PATIENT DISPOSITION:  PACU - hemodynamically stable.   Delay start of Pharmacological VTE agent (>24hrs) due to surgical blood loss or risk of bleeding: not applicable

## 2019-02-13 NOTE — Progress Notes (Signed)
Assisted Dr. Foster with right, ultrasound guided, popliteal/saphenous block. Side rails up, monitors on throughout procedure. See vital signs in flow sheet. Tolerated Procedure well. 

## 2019-02-13 NOTE — Anesthesia Procedure Notes (Signed)
Anesthesia Regional Block: Adductor canal block   Pre-Anesthetic Checklist: ,, timeout performed, Correct Patient, Correct Site, Correct Laterality, Correct Procedure, Correct Position, site marked, Risks and benefits discussed,  Surgical consent,  Pre-op evaluation,  At surgeon's request and post-op pain management  Laterality: Right  Prep: chloraprep       Needles:  Injection technique: Single-shot  Needle Type: Echogenic Stimulator Needle     Needle Length: 9cm  Needle Gauge: 21   Needle insertion depth: 7 cm   Additional Needles:   Procedures:,,,, ultrasound used (permanent image in chart),,,,  Narrative:  Start time: 02/13/2019 12:00 PM End time: 02/13/2019 12:05 PM Injection made incrementally with aspirations every 5 mL.  Performed by: Personally  Anesthesiologist: Mal Amabile, MD  Additional Notes: Timeout performed. Patient sedated. Relevant anatomy ID'd using [place]. Incremental 2-55ml injection of LA with frequent aspiration. Patient tolerated procedure well.        Right Adductor Canal Block

## 2019-02-13 NOTE — Anesthesia Preprocedure Evaluation (Addendum)
Anesthesia Evaluation  Patient identified by MRN, date of birth, ID band Patient awake    Reviewed: Allergy & Precautions, NPO status , Patient's Chart, lab work & pertinent test results  Airway Mallampati: II  TM Distance: >3 FB Neck ROM: Full    Dental  (+) Poor Dentition, Missing, Chipped   Pulmonary neg pulmonary ROS,    Pulmonary exam normal breath sounds clear to auscultation       Cardiovascular hypertension, Pt. on medications Normal cardiovascular exam+ Valvular Problems/Murmurs  Rhythm:Regular Rate:Normal     Neuro/Psych Diabetic peripheral neuropathy  Neuromuscular disease negative psych ROS   GI/Hepatic negative GI ROS, Neg liver ROS,   Endo/Other  diabetes, Poorly Controlled, Type 1, Insulin DependentHypothyroidism Hyperthyroidism Hyperlipidemia Obesity  Renal/GU Renal InsufficiencyRenal disease  negative genitourinary   Musculoskeletal  (+) Arthritis , Osteoarthritis,  Cervical stenosis Synovitis right ankle Right Peroneal tendonitis Instability right ankle   Abdominal (+) + obese,   Peds  Hematology   Anesthesia Other Findings   Reproductive/Obstetrics                            Anesthesia Physical Anesthesia Plan  ASA: III  Anesthesia Plan: Regional   Post-op Pain Management:    Induction: Intravenous  PONV Risk Score and Plan: 2 and Ondansetron, Propofol infusion and Treatment may vary due to age or medical condition  Airway Management Planned: Natural Airway and Nasal Cannula  Additional Equipment:   Intra-op Plan:   Post-operative Plan:   Informed Consent: I have reviewed the patients History and Physical, chart, labs and discussed the procedure including the risks, benefits and alternatives for the proposed anesthesia with the patient or authorized representative who has indicated his/her understanding and acceptance.     Dental advisory given  Plan  Discussed with: CRNA and Surgeon  Anesthesia Plan Comments:         Anesthesia Quick Evaluation

## 2019-02-13 NOTE — Transfer of Care (Signed)
Immediate Anesthesia Transfer of Care Note  Patient: Jeffrey Medina  Procedure(s) Performed: RIGHT ANKLE ARTHROSCOPY (Right Ankle) RIGHT ANKLE FLEXOR TENDON REPAIR (Right Ankle) RIGHT ANKLE LATERAL COLLATERAL LIGAMENT REPAIR (Right Ankle)  Patient Location: PACU  Anesthesia Type:MAC combined with regional for post-op pain  Level of Consciousness: awake, alert  and oriented  Airway & Oxygen Therapy: Patient Spontanous Breathing and Patient connected to nasal cannula oxygen  Post-op Assessment: Report given to RN and Post -op Vital signs reviewed and stable  Post vital signs: Reviewed and stable  Last Vitals:  Vitals Value Taken Time  BP 125/72 02/13/2019  4:07 PM  Temp    Pulse 70 02/13/2019  4:11 PM  Resp 15 02/13/2019  4:11 PM  SpO2 99 % 02/13/2019  4:11 PM  Vitals shown include unvalidated device data.  Last Pain:  Vitals:   02/13/19 1125  TempSrc: Oral  PainSc: 5       Patients Stated Pain Goal: 3 (00/93/81 8299)  Complications: No apparent anesthesia complications

## 2019-02-14 ENCOUNTER — Encounter (HOSPITAL_BASED_OUTPATIENT_CLINIC_OR_DEPARTMENT_OTHER): Payer: Self-pay | Admitting: Podiatry

## 2019-02-14 ENCOUNTER — Encounter: Payer: Self-pay | Admitting: Podiatry

## 2019-02-15 NOTE — Op Note (Signed)
OPERATIVE REPORT Patient name: Jeffrey PeaGregory K Klug MRN: [id number] DOB: 08/30/1961  DOS: 02/13/2019  Preop Dx: DJD/synovitis right ankle.  Peroneal tendon tear right ankle.  ATFL ligament tear right ankle. Postop Dx: same  Procedure:  1.  Ankle arthroscopy right 2.  Repair lateral collateral ankle ligament right 3.  Tenodesis/repair peroneal tendon right  Surgeon: Felecia ShellingBrent M. Nylee Barbuto DPM  Anesthesia: General anesthesia with popliteal regional leg block right lower extremity  Hemostasis: Calf tourniquet inflated to a pressure of 250mmHg after esmarch exsanguination   EBL: 50 mL Materials: Arthrex internal brace system. Injectables: None Pathology: None  Condition: The patient tolerated the procedure and anesthesia well. No complications noted or reported   Justification for procedure: The patient is a 58 y.o. male who presents today for surgical correction of DJD with synovitis right ankle, torn ATFL ligament right, torn peroneus brevis tendon righ. All conservative modalities of been unsuccessful in providing any sort of satisfactory alleviation of symptoms with the patient. The patient was told benefits as well as possible side effects of the surgery. The patient consented for surgical correction. The patient consent form was reviewed. All patient questions were answered. No guarantees were expressed or implied. The patient and the surgeon boson the patient consent form with the witness present and placed in the patient's chart.   Procedure in Detail: The patient was brought to the operating room, placed in the operating table in the supine position at which time an aseptic scrub and drape were performed about the patient's respective lower extremity after anesthesia was induced as described above. Attention was then directed to the surgical area where procedure number one commenced.  Procedure #1: Ankle arthroscopy right A small portal incision was planned and made about the anterior  medial aspect of the patient's Respective ankle joint. Prior to incision the ankle joint was inflated using 30 mL of normal saline. Incision, Measuring approximately 5 mm, was made just medial to the tibialis anterior tendon. A curved mosquito hemostat was utilized for blunt dissection down to the level of ankle joint capsule. A curved mosquito was utilized to perforate the ankle joint capsule to allow entry into the ankle joint. An obturator cannula was then utilized to enter the ankle joint through the medial portal incision site. Obturator was removed and a 2.9 mm Arthrex arthroscope Was inserted to visualize the ankle joint. Visualization of the ankle joint was achieved of both the medial gutter lateral gutter and anterior portion of the ankle joint. There were some superficial cartilaginous defects and synovitis tissue noted diffusely throughout the ankle joint. Intraoperative arthroscopic pictures were obtained. At this time Another portal incision was Made to the anterior lateral aspect of the patient's left ankle joint. The incision and penetration into the ankle joint was achieved in the same manner as previously mentioned in the previous incision. At this time a 3.0 mm Arthrex shaver/resector was inserted into the lateral portal and debridement of the lateral gutter and anterior lateral aspect of the ankle joint was performed using a sweeping motion with the arthroscopic shaver.Debridement of all the synovitis tissue was performed, including debridement of the anterior Inferior talofibular ligament. The ankle joint was then dorsiflexed and plantarflexed to ensure there was no impingement of the soft tissue to the anterior lateral gutter which was satisfactory. Instrumentation was removed and the arthroscope was placed in the anterolateral portal with the shaver in the anteromedial portal to resect away and debrided all soft tissue impingement and cartilaginous defects along the medial  gutter and medial  aspect of the patient's ankle joint. Debridement of the tissue was performed in the same manner as previously mentioned to the lateral aspect of the gutter. The arthroscope was utilized to visualize all aspects of the patient's ankle joint and ankle joint motion was Performed to ensure there was no soft tissue impingement. All instrument and she was then removed and the portal incision sites were reapproximated using 4-0 Prolene suture.  Procedure #2: Repair lateral collateral ankle ligament right Attention was then directed to the lateral aspect of the right ankle where a curved type incision was planned and made about the distal aspect of the fibular malleolus.  Incision was carried down to the level of bone and ligament with care taken to cut clamp ligate and retract away all small neurovascular structures traversing the incision site.  Extensive amount of scar tissue and nonabsorbable suture from prior surgery was identified and excisionally debrided.  Careful soft tissue dissection was achieved down to the level of the ankle joint where the ATFL ligament should anatomically be located.  There was little ligament remaining and more scar tissue in that area than ligament.  After appropriate visualization of this area the Arthrex internal brace system was placed in the standard operative technique.  Anchor was first placed into the body of the talus followed by internal fixation of a second anchor into the fibular malleolus approximately 1 cm proximal to the insertion of where the ATFL ligament should be.  Stabilization of the lateral ankle was visualized.  The internal brace system was inserted in the standard manner.  Procedure #3: Repair peroneal tendons right ankle Within the incision site previously mentioned procedure #2 the peroneal tendons were identified.  Attention was directed to these tendons and the peroneus brevis tendon had an abrupt disruption with a significant amount of scar tissue at the  bulbous portion of the end of the brevis tendon.  Sharp dissection was achieved and excessive scar tissue was removed.  The distal aspect of the peroneus brevis tendon was freshened and tenodesed under normal physiologic tension to the peroneus longus.  Tenodesis was performed with Arthrex 2-0 FiberWire.  Copious irrigation was then achieved in preparation for routine layered soft tissue closure.  3-0 Vicryl suture was utilized to reapproximate subcutaneous tissue followed by 4-0 Vicryl suture to reapproximate subcuticular tissue followed by 4-0 Prolene suture to reapproximate superficial skin edges  Dry sterile compressive dressings were then applied to all previously mentioned incision sites about the patient's lower extremity. The tourniquet which was used for hemostasis was deflated. All normal neurovascular responses including pink color and warmth returned all the digits of patient's lower extremity.  The patient was then transferred from the operating room to the recovery room having tolerated the procedure and anesthesia well. All vital signs are stable. After a brief stay in the recovery room the patient was discharged.  Patient stated that he already had a prescription for pain medication from the [place].  No additional prescriptions were provided for the patient.  Verbal as well as written instructions were provided for the patient regarding wound care. The patient is to keep the dressings clean dry and intact until they are to follow surgeon Dr. Gala Lewandowsky in the office upon discharge.   Felecia Shelling, DPM [facility]  Dr. Felecia Shelling, DPM    [address]  [place], [place] [address]                Office [phone]  Fax [phone]

## 2019-02-19 ENCOUNTER — Encounter: Payer: Self-pay | Admitting: Podiatry

## 2019-02-19 ENCOUNTER — Telehealth: Payer: Self-pay

## 2019-02-19 NOTE — Telephone Encounter (Signed)
Called pt post surgery on 02/13/2019; pt stated, "doing as well as to be expected, bandages still in tact, having some pain but still wearing boot"; I informed pt of importance of elevation and to call if any other questions before next post op 02/21/2019

## 2019-02-21 ENCOUNTER — Other Ambulatory Visit: Payer: Non-veteran care

## 2019-02-21 ENCOUNTER — Other Ambulatory Visit: Payer: Self-pay

## 2019-02-21 ENCOUNTER — Ambulatory Visit (INDEPENDENT_AMBULATORY_CARE_PROVIDER_SITE_OTHER): Payer: Non-veteran care

## 2019-02-21 ENCOUNTER — Encounter: Payer: Self-pay | Admitting: Podiatry

## 2019-02-21 ENCOUNTER — Other Ambulatory Visit: Payer: Self-pay | Admitting: Podiatry

## 2019-02-21 ENCOUNTER — Ambulatory Visit (INDEPENDENT_AMBULATORY_CARE_PROVIDER_SITE_OTHER): Payer: Non-veteran care | Admitting: Podiatry

## 2019-02-21 VITALS — Temp 97.9°F

## 2019-02-21 DIAGNOSIS — Z9889 Other specified postprocedural states: Secondary | ICD-10-CM

## 2019-02-21 DIAGNOSIS — M659 Synovitis and tenosynovitis, unspecified: Secondary | ICD-10-CM

## 2019-02-24 NOTE — Progress Notes (Signed)
   Subjective:  Patient presents today status post ankle arthroscopy with internal brace right. DOS: 02/13/2019. He states he is doing well. He denies any significant pain or modifying factors. He has been nonweightbearing using the CAM boot as directed. Patient is here for further evaluation and treatment.    Past Medical History:  Diagnosis Date  . Cellulitis    left ankle  . Chronic kidney disease    Renal Insufficiency  . Diabetes mellitus without complication (HCC)    Type 1, on pump  . Heart murmur   . Hypertension   . Hyperthyroidism    in the past  . Hypothyroidism   . Insulin pump in place    Omnipod Insulin Pump  . Neuropathy in diabetes (HCC)   . Seasonal allergies       Objective/Physical Exam Neurovascular status intact.  Skin incisions appear to be well coapted with sutures and staples intact. No sign of infectious process noted. No dehiscence. No active bleeding noted. Moderate edema noted to the surgical extremity.  Radiographic Exam:  Normal osseous mineralization. Joint spaces preserved. No fracture/dislocation/boney destruction.    Assessment: 1. s/p ankle arthroscopy with internal brace right. DOS: 02/13/2019   Plan of Care:  1. Patient was evaluated. X-rays reviewed 2. Dressing changed. Keep clean, dry and intact for one week.  3. Continue nonweightbearing in CAM boot.  4. Return to clinic in one week for suture removal.    Felecia Shelling, DPM [facility]  Dr. Felecia Shelling, DPM    [address]                                        [place], [place] [address]                Office [phone]  Fax [phone]

## 2019-02-28 ENCOUNTER — Other Ambulatory Visit: Payer: Self-pay

## 2019-02-28 ENCOUNTER — Ambulatory Visit (INDEPENDENT_AMBULATORY_CARE_PROVIDER_SITE_OTHER): Payer: Non-veteran care | Admitting: Podiatry

## 2019-02-28 VITALS — Temp 98.5°F

## 2019-02-28 DIAGNOSIS — Z9889 Other specified postprocedural states: Secondary | ICD-10-CM

## 2019-02-28 DIAGNOSIS — M659 Synovitis and tenosynovitis, unspecified: Secondary | ICD-10-CM

## 2019-03-03 NOTE — Progress Notes (Signed)
   Subjective:  Patient presents today status post ankle arthroscopy with internal brace right. DOS: 02/13/2019. He states he is improving. He reports some soreness. there are no modifying factors noted and he has been nonweightbearing in the CAM boot as directed. Patient is here for further evaluation and treatment.    Past Medical History:  Diagnosis Date  . Cellulitis    left ankle  . Chronic kidney disease    Renal Insufficiency  . Diabetes mellitus without complication (HCC)    Type 1, on pump  . Heart murmur   . Hypertension   . Hyperthyroidism    in the past  . Hypothyroidism   . Insulin pump in place    Omnipod Insulin Pump  . Neuropathy in diabetes ([place])   . Seasonal allergies       Objective/Physical Exam Neurovascular status intact.  Skin incisions appear to be well coapted with sutures and staples intact. No sign of infectious process noted. No dehiscence. No active bleeding noted. Moderate edema noted to the surgical extremity.  Assessment: 1. s/p ankle arthroscopy with internal brace right. DOS: 02/13/2019   Plan of Care:  1. Patient was evaluated. 2. Sutures removed. Staples left intact.  3. Continue nonweightbearing in CAM boot for two additional weeks.  4. Return to clinic in one week for staple removal.    Edrick Kins, DPM [facility]  Dr. Edrick Kins, [address]                                        [place], [place] [address]                Office [phone]  Fax [phone]

## 2019-03-05 ENCOUNTER — Ambulatory Visit (INDEPENDENT_AMBULATORY_CARE_PROVIDER_SITE_OTHER): Payer: Non-veteran care | Admitting: Podiatry

## 2019-03-05 ENCOUNTER — Other Ambulatory Visit: Payer: Self-pay

## 2019-03-05 ENCOUNTER — Ambulatory Visit: Payer: Non-veteran care

## 2019-03-05 VITALS — Temp 97.7°F

## 2019-03-05 DIAGNOSIS — Z9889 Other specified postprocedural states: Secondary | ICD-10-CM | POA: Diagnosis not present

## 2019-03-05 DIAGNOSIS — M25371 Other instability, right ankle: Secondary | ICD-10-CM

## 2019-03-05 DIAGNOSIS — M659 Synovitis and tenosynovitis, unspecified: Secondary | ICD-10-CM

## 2019-03-05 MED ORDER — DOXYCYCLINE HYCLATE 100 MG PO TABS: 100.0000 mg | ORAL_TABLET | Freq: Two times a day (BID) | ORAL | 0 refills | Status: DC

## 2019-03-05 NOTE — Progress Notes (Signed)
He presents today for postop visit #2 date of surgery 02/13/2019 ankle arthroscopy.  He denies fever chills nausea vomiting muscle aches and pain states that it really does not hurt unless he puts weight on it.  He states that he has been walking on it and was told that it was okay to do so by Dr. Amalia Hailey.  Though he does present today with an scooter.  Objective: Staples were removed today mild edema mild erythema no cellulitis drainage or odor.  Assessment well-healing surgical foot.  Plan: Encourage range of motion exercises and will allow him to start getting this wet and soaking in Epsom salts warm water and also started him on doxycycline as a preventative for this mild erythema.  He will follow-up with Dr. Amalia Hailey in 1 week

## 2019-03-12 ENCOUNTER — Encounter: Payer: Self-pay | Admitting: Emergency Medicine

## 2019-03-12 ENCOUNTER — Emergency Department
Admission: EM | Admit: 2019-03-12 | Discharge: 2019-03-12 | Disposition: A | Discharge status: Home / Self Care | Payer: No Typology Code available for payment source | Attending: Emergency Medicine | Admitting: Emergency Medicine

## 2019-03-12 ENCOUNTER — Other Ambulatory Visit: Payer: Self-pay

## 2019-03-12 DIAGNOSIS — Z5321 Procedure and treatment not carried out due to patient leaving prior to being seen by health care provider: Secondary | ICD-10-CM | POA: Insufficient documentation

## 2019-03-12 DIAGNOSIS — H9201 Otalgia, right ear: Secondary | ICD-10-CM | POA: Insufficient documentation

## 2019-03-12 NOTE — ED Triage Notes (Addendum)
Pt to triage via scooter with no distress noted; pt reports right sided earache since Saturday; rx doxycycline on Monday for outer earlobe infection by urgent care without relief

## 2019-03-14 ENCOUNTER — Other Ambulatory Visit: Payer: Non-veteran care

## 2019-03-18 ENCOUNTER — Encounter: Payer: Self-pay | Admitting: Podiatry

## 2019-03-18 ENCOUNTER — Other Ambulatory Visit: Payer: Self-pay

## 2019-03-18 ENCOUNTER — Ambulatory Visit (INDEPENDENT_AMBULATORY_CARE_PROVIDER_SITE_OTHER): Payer: Non-veteran care | Admitting: Podiatry

## 2019-03-18 VITALS — Temp 97.6°F

## 2019-03-18 DIAGNOSIS — M25371 Other instability, right ankle: Secondary | ICD-10-CM

## 2019-03-18 DIAGNOSIS — Z9889 Other specified postprocedural states: Secondary | ICD-10-CM

## 2019-03-20 NOTE — Progress Notes (Signed)
   Subjective:  Patient presents today status post right ankle ligament stabilization. DOS: 02/13/2019. He states he is doing well. He denies any significant pain or modifying factors. He has been using the CAM boot as directed. Patient is here for further evaluation and treatment.    Past Medical History:  Diagnosis Date  . Cellulitis    left ankle  . Chronic kidney disease    Renal Insufficiency  . Diabetes mellitus without complication (HCC)    Type 1, on pump  . Heart murmur   . Hypertension   . Hyperthyroidism    in the past  . Hypothyroidism   . Insulin pump in place    Omnipod Insulin Pump  . Neuropathy in diabetes ([place])   . Seasonal allergies       Objective/Physical Exam Neurovascular status intact.  Skin incisions appear to be well coapted with sutures and staples intact. No sign of infectious process noted. No dehiscence. No active bleeding noted. Moderate edema noted to the surgical extremity.  Assessment: 1. s/p right ankle ligament stabilization. DOS: 02/13/2019   Plan of Care:  1. Patient was evaluated.  2. Transition out of CAM boot into good sneakers.  3. Continue range of motion exercises.  4. Return to clinic in 4 weeks.    Edrick Kins, DPM [facility]  Dr. Edrick Kins, [address]                                        [place], [place] [address]                Office [phone]  Fax [phone]

## 2019-04-18 ENCOUNTER — Other Ambulatory Visit: Payer: Self-pay

## 2019-04-18 ENCOUNTER — Encounter: Payer: Non-veteran care | Admitting: Podiatry

## 2019-05-02 ENCOUNTER — Ambulatory Visit (INDEPENDENT_AMBULATORY_CARE_PROVIDER_SITE_OTHER): Payer: No Typology Code available for payment source

## 2019-05-02 ENCOUNTER — Encounter: Payer: Self-pay | Admitting: Podiatry

## 2019-05-02 ENCOUNTER — Ambulatory Visit (INDEPENDENT_AMBULATORY_CARE_PROVIDER_SITE_OTHER): Payer: Self-pay | Admitting: Podiatry

## 2019-05-02 ENCOUNTER — Other Ambulatory Visit: Payer: Self-pay

## 2019-05-02 DIAGNOSIS — M25371 Other instability, right ankle: Secondary | ICD-10-CM | POA: Diagnosis not present

## 2019-05-02 DIAGNOSIS — S93401D Sprain of unspecified ligament of right ankle, subsequent encounter: Secondary | ICD-10-CM

## 2019-05-02 MED ORDER — MELOXICAM 15 MG PO TABS: 15.0000 mg | ORAL_TABLET | Freq: Every day | ORAL | 1 refills | Status: AC

## 2019-05-04 NOTE — Progress Notes (Signed)
   Subjective:  Patient presents today status post right ankle ligament stabilization. DOS: 02/13/2019. He states he was doing well but he twisted the ankle about two weeks ago. He reports falling several times in a hole dug by his dog near his house. He reports associated constant pain and swelling. He has been taking Oxycodone for pain. Walking and standing increases the pain. Patient is here for further evaluation and treatment.    Past Medical History:  Diagnosis Date  . Cellulitis    left ankle  . Chronic kidney disease    Renal Insufficiency  . Diabetes mellitus without complication (HCC)    Type 1, on pump  . Heart murmur   . Hypertension   . Hyperthyroidism    in the past  . Hypothyroidism   . Insulin pump in place    Omnipod Insulin Pump  . Neuropathy in diabetes ([place])   . Seasonal allergies       Objective/Physical Exam Neurovascular status intact.  Skin incisions appear to be well coapted. No sign of infectious process noted. No dehiscence. No active bleeding noted. Moderate edema noted to the surgical extremity.  Radiographic Exam:  Normal osseous mineralization. Joint spaces preserved. No fracture/dislocation/boney destruction.    Assessment: 1. s/p right ankle ligament stabilization. DOS: 02/13/2019 2. Acute right ankle sprain   Plan of Care:  1. Patient was evaluated. X-Rays reviewed.  2. Prescription for Meloxicam provided to patient. 3. Resume using CAM boot for three weeks. Weightbearing as tolerated.  4. Return to clinic in 4 weeks.     Edrick Kins, DPM [facility]  Dr. Edrick Kins, [address]                                        [place], [place] [address]                Office [phone]  Fax [phone]

## 2019-06-03 ENCOUNTER — Encounter: Payer: No Typology Code available for payment source | Admitting: Podiatry

## 2019-06-13 ENCOUNTER — Ambulatory Visit (INDEPENDENT_AMBULATORY_CARE_PROVIDER_SITE_OTHER): Payer: Non-veteran care | Admitting: Podiatry

## 2019-06-13 ENCOUNTER — Other Ambulatory Visit: Payer: Self-pay

## 2019-06-13 ENCOUNTER — Encounter: Payer: Self-pay | Admitting: Podiatry

## 2019-06-13 DIAGNOSIS — S93401D Sprain of unspecified ligament of right ankle, subsequent encounter: Secondary | ICD-10-CM

## 2019-06-13 DIAGNOSIS — M25371 Other instability, right ankle: Secondary | ICD-10-CM

## 2019-06-15 NOTE — Progress Notes (Signed)
   Subjective:  Patient presents today status post right ankle ligament stabilization. DOS: 02/13/2019. He is also here for follow up evaluation of a right ankle sprain. He states he is doing a lot better and has improved significantly. He does reports some mild continued pain. He has been taking Meloxicam and using the CAM boot as directed. Being on his feet for too long makes the pain worse. Patient is here for further evaluation and treatment.    Past Medical History:  Diagnosis Date  . Cellulitis    left ankle  . Chronic kidney disease    Renal Insufficiency  . Diabetes mellitus without complication (HCC)    Type 1, on pump  . Heart murmur   . Hypertension   . Hyperthyroidism    in the past  . Hypothyroidism   . Insulin pump in place    Omnipod Insulin Pump  . Neuropathy in diabetes ([place])   . Seasonal allergies       Objective/Physical Exam Neurovascular status intact.  Skin incisions appear to be well coapted. No sign of infectious process noted. No dehiscence. No active bleeding noted. Moderate edema noted to the surgical extremity.  Assessment: 1. s/p right ankle ligament stabilization. DOS: 02/13/2019 2. Acute right ankle sprain - resolved    Plan of Care:  1. Patient was evaluated.  2. May resume full activity with no restrictions.  3. Continue wearing orthopedic shoes / boots.  4. Return to clinic as needed.     Edrick Kins, DPM [facility]  Dr. Edrick Kins, [place]                                        [place],  [address]                Office [phone]  Fax [phone]
